# Patient Record
Sex: Male | Born: 1970 | Race: White | Hispanic: No | Smoking: Former smoker
Health system: Southern US, Community
[De-identification: ages and names within clinical notes are randomized; demographics above are authoritative.]

## PROBLEM LIST (undated history)

## (undated) DIAGNOSIS — I10 Essential (primary) hypertension: Secondary | ICD-10-CM

## (undated) DIAGNOSIS — N289 Disorder of kidney and ureter, unspecified: Secondary | ICD-10-CM

## (undated) DIAGNOSIS — K219 Gastro-esophageal reflux disease without esophagitis: Secondary | ICD-10-CM

## (undated) DIAGNOSIS — C801 Malignant (primary) neoplasm, unspecified: Secondary | ICD-10-CM

## (undated) DIAGNOSIS — R569 Unspecified convulsions: Secondary | ICD-10-CM

## (undated) DIAGNOSIS — Z87828 Personal history of other (healed) physical injury and trauma: Secondary | ICD-10-CM

## (undated) HISTORY — PX: APPENDECTOMY: SHX54

## (undated) HISTORY — DX: Gastro-esophageal reflux disease without esophagitis: K21.9

---

## 2001-04-28 ENCOUNTER — Ambulatory Visit (HOSPITAL_COMMUNITY): Admission: RE | Admit: 2001-04-28 | Discharge: 2001-04-28 | Payer: Self-pay | Admitting: Neurology

## 2001-04-28 ENCOUNTER — Encounter: Payer: Self-pay | Admitting: Neurology

## 2004-02-21 ENCOUNTER — Emergency Department (HOSPITAL_COMMUNITY): Admission: EM | Admit: 2004-02-21 | Discharge: 2004-02-22 | Payer: Self-pay | Admitting: Emergency Medicine

## 2005-02-10 HISTORY — PX: OTHER SURGICAL HISTORY: SHX169

## 2005-09-28 ENCOUNTER — Emergency Department (HOSPITAL_COMMUNITY): Admission: EM | Admit: 2005-09-28 | Discharge: 2005-09-28 | Payer: Self-pay | Admitting: Emergency Medicine

## 2005-10-06 ENCOUNTER — Emergency Department (HOSPITAL_COMMUNITY): Admission: EM | Admit: 2005-10-06 | Discharge: 2005-10-06 | Payer: Self-pay | Admitting: Emergency Medicine

## 2005-10-15 ENCOUNTER — Encounter (INDEPENDENT_AMBULATORY_CARE_PROVIDER_SITE_OTHER): Payer: Self-pay | Admitting: Specialist

## 2005-10-15 ENCOUNTER — Observation Stay (HOSPITAL_COMMUNITY): Admission: RE | Admit: 2005-10-15 | Discharge: 2005-10-16 | Payer: Self-pay | Admitting: Urology

## 2005-11-13 ENCOUNTER — Ambulatory Visit (HOSPITAL_COMMUNITY): Payer: Self-pay | Admitting: Oncology

## 2005-11-13 ENCOUNTER — Encounter (HOSPITAL_COMMUNITY): Admission: RE | Admit: 2005-11-13 | Discharge: 2005-12-13 | Payer: Self-pay | Admitting: Oncology

## 2005-11-13 ENCOUNTER — Encounter: Admission: RE | Admit: 2005-11-13 | Discharge: 2005-11-13 | Payer: Self-pay | Admitting: Oncology

## 2005-11-19 ENCOUNTER — Ambulatory Visit (HOSPITAL_COMMUNITY): Admission: RE | Admit: 2005-11-19 | Discharge: 2005-11-19 | Payer: Self-pay | Admitting: Oncology

## 2005-12-16 ENCOUNTER — Ambulatory Visit (HOSPITAL_COMMUNITY): Payer: Self-pay | Admitting: Oncology

## 2005-12-16 ENCOUNTER — Encounter (HOSPITAL_COMMUNITY): Admission: RE | Admit: 2005-12-16 | Discharge: 2006-01-15 | Payer: Self-pay | Admitting: Oncology

## 2005-12-16 ENCOUNTER — Encounter: Admission: RE | Admit: 2005-12-16 | Discharge: 2005-12-16 | Payer: Self-pay | Admitting: Oncology

## 2006-03-24 ENCOUNTER — Emergency Department (HOSPITAL_COMMUNITY): Admission: EM | Admit: 2006-03-24 | Discharge: 2006-03-24 | Payer: Self-pay | Admitting: Emergency Medicine

## 2006-03-27 ENCOUNTER — Encounter (HOSPITAL_COMMUNITY): Admission: RE | Admit: 2006-03-27 | Discharge: 2006-04-26 | Payer: Self-pay | Admitting: Oncology

## 2006-03-27 ENCOUNTER — Ambulatory Visit (HOSPITAL_COMMUNITY): Payer: Self-pay | Admitting: Oncology

## 2006-04-03 ENCOUNTER — Emergency Department (HOSPITAL_COMMUNITY): Admission: EM | Admit: 2006-04-03 | Discharge: 2006-04-03 | Payer: Self-pay | Admitting: Emergency Medicine

## 2006-04-30 ENCOUNTER — Emergency Department (HOSPITAL_COMMUNITY): Admission: EM | Admit: 2006-04-30 | Discharge: 2006-04-30 | Payer: Self-pay | Admitting: Emergency Medicine

## 2006-07-15 ENCOUNTER — Emergency Department (HOSPITAL_COMMUNITY): Admission: EM | Admit: 2006-07-15 | Discharge: 2006-07-15 | Payer: Self-pay | Admitting: Emergency Medicine

## 2006-09-28 ENCOUNTER — Encounter (HOSPITAL_COMMUNITY): Admission: RE | Admit: 2006-09-28 | Discharge: 2006-10-28 | Payer: Self-pay | Admitting: Oncology

## 2006-09-28 ENCOUNTER — Ambulatory Visit (HOSPITAL_COMMUNITY): Payer: Self-pay | Admitting: Oncology

## 2007-03-09 ENCOUNTER — Emergency Department (HOSPITAL_COMMUNITY): Admission: EM | Admit: 2007-03-09 | Discharge: 2007-03-09 | Payer: Self-pay | Admitting: Emergency Medicine

## 2007-07-12 ENCOUNTER — Emergency Department (HOSPITAL_COMMUNITY): Admission: EM | Admit: 2007-07-12 | Discharge: 2007-07-12 | Payer: Self-pay | Admitting: Emergency Medicine

## 2008-08-14 ENCOUNTER — Emergency Department (HOSPITAL_COMMUNITY): Admission: EM | Admit: 2008-08-14 | Discharge: 2008-08-14 | Payer: Self-pay | Admitting: Emergency Medicine

## 2009-02-05 ENCOUNTER — Emergency Department (HOSPITAL_COMMUNITY): Admission: EM | Admit: 2009-02-05 | Discharge: 2009-02-05 | Payer: Self-pay | Admitting: Emergency Medicine

## 2009-02-17 ENCOUNTER — Emergency Department (HOSPITAL_COMMUNITY): Admission: EM | Admit: 2009-02-17 | Discharge: 2009-02-17 | Payer: Self-pay | Admitting: Emergency Medicine

## 2009-08-21 ENCOUNTER — Emergency Department (HOSPITAL_COMMUNITY): Admission: EM | Admit: 2009-08-21 | Discharge: 2009-08-22 | Payer: Self-pay | Admitting: Emergency Medicine

## 2010-03-03 ENCOUNTER — Encounter (HOSPITAL_COMMUNITY): Payer: Self-pay | Admitting: Oncology

## 2010-03-03 ENCOUNTER — Encounter: Payer: Self-pay | Admitting: Emergency Medicine

## 2010-05-13 LAB — DIFFERENTIAL
Basophils Absolute: 0 10*3/uL (ref 0.0–0.1)
Basophils Relative: 0 % (ref 0–1)
Eosinophils Relative: 1 % (ref 0–5)
Lymphs Abs: 2.7 10*3/uL (ref 0.7–4.0)
Monocytes Absolute: 0.6 10*3/uL (ref 0.1–1.0)
Monocytes Relative: 6 % (ref 3–12)
Neutrophils Relative %: 67 % (ref 43–77)

## 2010-05-13 LAB — CBC
HCT: 53.2 % — ABNORMAL HIGH (ref 39.0–52.0)
Hemoglobin: 18.2 g/dL — ABNORMAL HIGH (ref 13.0–17.0)
MCHC: 34.2 g/dL (ref 30.0–36.0)
Platelets: 221 10*3/uL (ref 150–400)
RBC: 5.38 MIL/uL (ref 4.22–5.81)
RDW: 12.8 % (ref 11.5–15.5)

## 2010-05-13 LAB — BASIC METABOLIC PANEL
BUN: 10 mg/dL (ref 6–23)
GFR calc Af Amer: >60 mL/min (ref >60)
GFR calc non Af Amer: >60 mL/min (ref >60)

## 2010-05-13 LAB — URINALYSIS, ROUTINE W REFLEX MICROSCOPIC
Bilirubin Urine: NEGATIVE
Glucose, UA: NEGATIVE mg/dL
Hgb urine dipstick: NEGATIVE
Ketones, ur: NEGATIVE mg/dL
Nitrite: NEGATIVE
Protein, ur: NEGATIVE mg/dL
Urobilinogen, UA: 0.2 mg/dL (ref 0.0–1.0)
pH: 5.5 (ref 5.0–8.0)

## 2010-05-19 LAB — CBC
Hemoglobin: 17.1 g/dL — ABNORMAL HIGH (ref 13.0–17.0)
MCHC: 35.2 g/dL (ref 30.0–36.0)
WBC: 8.7 10*3/uL (ref 4.0–10.5)

## 2010-05-19 LAB — BASIC METABOLIC PANEL
CO2: 30 mEq/L (ref 19–32)
Calcium: 9.8 mg/dL (ref 8.4–10.5)
Chloride: 107 mEq/L (ref 96–112)
Creatinine, Ser: 1.46 mg/dL (ref 0.4–1.5)
GFR calc non Af Amer: 55 mL/min — ABNORMAL LOW (ref >60)
Potassium: 3.9 mEq/L (ref 3.5–5.1)
Sodium: 141 mEq/L (ref 135–145)

## 2010-05-19 LAB — DIFFERENTIAL
Eosinophils Relative: 3 % (ref 0–5)
Lymphocytes Relative: 30 % (ref 12–46)
Lymphs Abs: 2.6 10*3/uL (ref 0.7–4.0)
Monocytes Absolute: 0.6 10*3/uL (ref 0.1–1.0)
Neutro Abs: 5.2 10*3/uL (ref 1.7–7.7)

## 2010-05-19 LAB — URINALYSIS, ROUTINE W REFLEX MICROSCOPIC
Bilirubin Urine: NEGATIVE
Glucose, UA: NEGATIVE mg/dL
Hgb urine dipstick: NEGATIVE
Protein, ur: NEGATIVE mg/dL
pH: 5.5 (ref 5.0–8.0)

## 2010-05-29 ENCOUNTER — Emergency Department (HOSPITAL_COMMUNITY)
Admission: EM | Admit: 2010-05-29 | Discharge: 2010-05-29 | Disposition: A | Discharge status: Home / Self Care | Payer: Self-pay | Attending: Emergency Medicine | Admitting: Emergency Medicine

## 2010-05-29 DIAGNOSIS — K089 Disorder of teeth and supporting structures, unspecified: Secondary | ICD-10-CM | POA: Insufficient documentation

## 2010-05-29 DIAGNOSIS — Z8547 Personal history of malignant neoplasm of testis: Secondary | ICD-10-CM | POA: Insufficient documentation

## 2010-05-29 DIAGNOSIS — G40909 Epilepsy, unspecified, not intractable, without status epilepticus: Secondary | ICD-10-CM | POA: Insufficient documentation

## 2010-06-28 NOTE — Op Note (Signed)
Larry Hoover, Larry Hoover                ACCOUNT NO.:  [id number]   MEDICAL RECORD NO.:  [id number]          PATIENT TYPE:  OBV   LOCATION:  A318                          FACILITY:  [facility]   PHYSICIAN:  Dennie Maizes, M.D.   DATE OF BIRTH:  02/16/1970   DATE OF PROCEDURE:  10/15/2005  DATE OF DISCHARGE:                                 OPERATIVE REPORT   PREOPERATIVE DIAGNOSIS:  Right testicular mass.   POSTOPERATIVE DIAGNOSIS:  Right testicular mass.   OPERATIVE PROCEDURE:  Right inguinal exploration and right radical  orchiectomy.   ANESTHESIA:  General.   SURGEON:  Dennie Maizes, M.D.   COMPLICATIONS:  None.   ESTIMATED BLOOD LOSS:  Minimal.   SPECIMEN:  Right testis and spermatic cord.   COMPLICATIONS:  None.   INDICATIONS FOR PROCEDURE:  This 40 year old male with a solid right  testicular mass was taken to the OR today for right inguinal exploration and  right radical orchiectomy.   DESCRIPTION OF PROCEDURE:  General anesthesia was induced, and the patient  was placed on the OR table in the supine position.  The lower abdomen and  genitalia were prepped and draped in a sterile fashion.  Examination  revealed an 8-cm size right solid testicular mass.   A right inguinal incision was then made.  The subcutaneous tissues and  Scarpa's fascia were divided to expose the external oblique aponeurosis.  The external inguinal ring was identified.  The external oblique aponeurosis  was opened and the inguinal canal was entered.  The spermatic cord  structures were then held with a Penrose drain.  The ilioinguinal nerve was  identified and safeguarded.  The spermatic cord was then crossclamped with a  hemostat.  The testis was then delivered out of the inguinal incision.  The  testis was separated from the scrotal bed.  The spermatic cord was then  dissected free up to the level of the internal inguinal ring.  The vas was  identified, clamped, divided and ligated on both sides.  The  spermatic cord  was then clamped at the level of the internal inguinal ring and divided.  Suture ligation of the proximal end of the spermatic cord was then done.  A  marking suture with 2-0 silk was also applied over the spermatic cord.  There was no active bleeding at this time.  The external oblique aponeurosis  was closed using 3-0 Vicryl.  The Scarpa's fascia was then closed using 3-0  Vicryl.  The wound was then irrigated with saline.  Then 5 mL of 0.5%  Marcaine was injected around the incision for postoperative  analgesia.  The skin incision was then closed using staples.  Estimated  blood loss was minimal.  The sponges and instruments were correct x2 at the  time of closure.  The patient was transferred to the PACU in a satisfactory  condition.      Dennie Maizes, M.D.  Electronically Signed     SK/MEDQ  D:  10/15/2005  T:  10/15/2005  Job:  [id number]

## 2010-06-28 NOTE — H&P (Signed)
Larry Hoover, GOLDWATER                ACCOUNT NO.:  [id number]   MEDICAL RECORD NO.:  [id number]          PATIENT TYPE:  AMB   LOCATION:  DAY                           FACILITY:  [facility]   PHYSICIAN:  Dennie Maizes, M.D.   DATE OF BIRTH:  11-Sep-1970   DATE OF ADMISSION:  10/15/2005  DATE OF DISCHARGE:  LH                                HISTORY & PHYSICAL   CHIEF COMPLAINT:  Swelling of the testes, pain in right testes.   HISTORY OF PRESENT ILLNESS:  This 40 year old male was referred to me from  the ER at [facility].  He has noticed a swelling of the right  testes for the past four months.  Swelling has been increasing in size.  There is no history of trauma to the scrotum.  Developed severe right testes  and he went to the emergency room at [facility].  He was evaluated  with an ultrasound of the scrotum. These revealed two solid testicular  masses on the right side.  Left testes was normal.  Also was evaluated with  a CT of the abdomen and pelvis with contrast.  This revealed small right  renal calculi, bilateral renal cysts were noted.  There was no evidence of  metastatic lesions or lymphadenopathy inside the abdomen.  The patient was  referred to me for further evaluation and management.   The patient did not have any voiding difficulty.  He has urinary frequency  x3 to 4 and nocturia and x1 to 2.  There is no history of fever, chills or  flank pain.  There is no past history of GU disease.   PAST MEDICAL HISTORY:  1. He has seizure disorder.  2. Memory loss.  3. Cluster headaches.  4. Depression.   MEDICATIONS:  1. Percocet 7.5/325 mg 1 p.o. q.8h. p.r.n. pain.  2. Xanax 2 mg p.o. q.i.d.  3. Lexapro 10 mg 1 p.o. daily.  4. Aspirin 81 mg p.o. daily which has been discontinued for the surgery.   1. Imipramine 50 mg at bed time.  2. Phenobarbital 30 mg 2 at bed time.   ALLERGIES:  PENICILLIN.   EXAMINATION:  Height 5 feet 10, weight 155 pounds.  Head,  eyes, ears, nose  and throat normal.  Neck no masses.  Lungs clear to auscultation.  Heart  regular rate and rhythm.  No murmurs.  No flank masses or CVA tenderness.  Bladder not palpable.  Skin is normal.  Left testes is normal.  There is a  lot of swelling in the right testes region.  It is firm in consistency.  Testes as mentioned is about 8 x 6 cm in size.  Rectal examination not done.   IMPRESSION:  Right testicular mass.  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   PLAN:  I have discussed with the patient and his wife regarding the  diagnosis and management options.  He is scheduled to undergo a right  inguinal exploration with a right orchectomy and anesthesia in the short-  stay center.  I explained to the patient regarding the diagnosis, operative  details,  alternate treatments, the outcome, possible risks and complications  and he has agreed for the procedure to be done.  In view of the history of  his seizure disorder, he will be kept under observation for 24 hours.      Dennie Maizes, M.D.  Electronically Signed     SK/MEDQ  D:  10/14/2005  T:  10/15/2005  Job:  [id number]

## 2010-10-31 LAB — POCT I-STAT CREATININE: Creatinine, Ser: 1

## 2010-11-22 LAB — COMPREHENSIVE METABOLIC PANEL
ALT: 18
Albumin: 3.9
Alkaline Phosphatase: 69
Chloride: 104
Glucose, Bld: 94
Potassium: 3.6
Sodium: 138
Total Bilirubin: 0.5
Total Protein: 6.4

## 2010-11-22 LAB — AFP TUMOR MARKER: AFP-Tumor Marker: 1.7

## 2010-11-22 LAB — CBC
HCT: 44
Hemoglobin: 14.9
Platelets: 236
RDW: 13.2
WBC: 7

## 2010-11-22 LAB — DIFFERENTIAL
Basophils Absolute: 0
Basophils Relative: 1
Eosinophils Absolute: 0.4
Monocytes Absolute: 0.4
Monocytes Relative: 6

## 2010-11-22 LAB — BETA HCG QUANT (REF LAB): Beta hCG, Tumor Marker: 0.8 m[IU]/mL (ref <5.0)

## 2011-01-07 ENCOUNTER — Emergency Department (HOSPITAL_COMMUNITY)
Admission: EM | Admit: 2011-01-07 | Discharge: 2011-01-07 | Disposition: A | Discharge status: Home / Self Care | Payer: Self-pay | Attending: Emergency Medicine | Admitting: Emergency Medicine

## 2011-01-07 ENCOUNTER — Encounter: Payer: Self-pay | Admitting: Emergency Medicine

## 2011-01-07 ENCOUNTER — Emergency Department (HOSPITAL_COMMUNITY): Payer: Self-pay

## 2011-01-07 DIAGNOSIS — Z8547 Personal history of malignant neoplasm of testis: Secondary | ICD-10-CM | POA: Insufficient documentation

## 2011-01-07 DIAGNOSIS — G8929 Other chronic pain: Secondary | ICD-10-CM | POA: Insufficient documentation

## 2011-01-07 DIAGNOSIS — M549 Dorsalgia, unspecified: Secondary | ICD-10-CM | POA: Insufficient documentation

## 2011-01-07 HISTORY — DX: Malignant (primary) neoplasm, unspecified: C80.1

## 2011-01-07 HISTORY — DX: Unspecified convulsions: R56.9

## 2011-01-07 MED ORDER — KETOROLAC TROMETHAMINE 60 MG/2ML IM SOLN
60.0000 mg | Freq: Once | INTRAMUSCULAR | Status: AC
  Administered 2011-01-07: 60 mg via INTRAMUSCULAR
  Filled 2011-01-07: qty 2

## 2011-01-07 MED ORDER — HYDROMORPHONE HCL PF 2 MG/ML IJ SOLN
2.0000 mg | Freq: Once | INTRAMUSCULAR | Status: AC
  Administered 2011-01-07: 2 mg via INTRAMUSCULAR
  Filled 2011-01-07: qty 1

## 2011-01-07 NOTE — ED Notes (Signed)
Pt reports chronic back that started 4 months ago. Pt states "I have a crushed vertabrae in my back and I'm suppose to have surgery but I can't afford it." pt  Reports severe pain started last night after carrying fire wood.

## 2011-01-07 NOTE — ED Notes (Signed)
Pain medication given as ordered. Pt rates lower back pain 10/10. No needs voiced. Family member in room.

## 2011-01-07 NOTE — ED Provider Notes (Signed)
History     CSN: [id number] Arrival date & time: 01/07/2011  5:00 AM   First MD Initiated Contact with Patient 01/07/11 0534      Chief Complaint  Patient presents with  . Back Pain    (Consider location/radiation/quality/duration/timing/severity/associated sxs/prior treatment) Patient is a 40 y.o. male presenting with back pain. The history is provided by the patient.  Back Pain  This is a chronic problem. The problem occurs constantly. The problem has not changed since onset.The pain is associated with lifting heavy objects. The pain is present in the lumbar spine. The quality of the pain is described as shooting. The pain radiates to the right thigh. The pain is severe. The symptoms are aggravated by bending, twisting and certain positions. The pain is the same all the time. Pertinent negatives include no chest pain, no fever, no numbness, no weight loss, no headaches, no abdominal pain, no abdominal swelling, no bowel incontinence, no perianal numbness, no bladder incontinence, no dysuria, no pelvic pain, no leg pain, no paresthesias, no paresis, no tingling and no weakness. Treatments tried: Xanax and Percocet without relief. The treatment provided no relief. Risk factors include a sedentary lifestyle.   patient with chronic back pain presents with right lower back pain rating right leg after lifting firewood. Pain improved with heating pad and medications at home, presents here. Sharp in quality. Has history of testicular cancer about 5 years ago. He has been evaluated by multiple physicians for back pain and told that he needs an MRI and it has also been referred to a Careers adviser. No fall or trauma otherwise. No Alleviating factors.  Past Medical History  Diagnosis Date  . Cancer   . Seizures     Past Surgical History  Procedure Date  . Right testical removed 2007    History reviewed. No pertinent family history.  History  Substance Use Topics  . Smoking status: Never Smoker     . Smokeless tobacco: Not on file  . Alcohol Use: No      Review of Systems  Constitutional: Negative for fever, chills and weight loss.  HENT: Negative for neck pain and neck stiffness.   Eyes: Negative for pain.  Respiratory: Negative for shortness of breath.   Cardiovascular: Negative for chest pain, palpitations and leg swelling.  Gastrointestinal: Negative for vomiting, abdominal pain and bowel incontinence.  Genitourinary: Negative for bladder incontinence, dysuria and pelvic pain.  Musculoskeletal: Positive for back pain.  Skin: Negative for rash.  Neurological: Negative for tingling, weakness, numbness, headaches and paresthesias.  All other systems reviewed and are negative.    Allergies  Penicillins  Home Medications   Current Outpatient Rx  Name Route Sig Dispense Refill  . ALPRAZOLAM 1 MG PO TABS Oral Take 1 mg by mouth every 2 (two) hours as needed.      . CYCLOBENZAPRINE HCL 10 MG PO TABS Oral Take 10 mg by mouth 3 (three) times daily as needed.      . OXYCODONE-ACETAMINOPHEN 10-325 MG PO TABS       . TRAMADOL HCL 50 MG PO TABS Oral Take 50 mg by mouth 4 (four) times daily as needed. Maximum dose= 8 tablets per day       BP 118/94  Pulse 100  Temp(Src) 97.7 F (36.5 C) (Oral)  Resp 18  Ht 5\' 10"  (1.778 m)  Wt 185 lb (83.915 kg)  BMI 26.54 kg/m2  SpO2 97%  Physical Exam  Constitutional: He is oriented to person, place, and  time. He appears well-developed and well-nourished.  HENT:  Head: Normocephalic and atraumatic.  Eyes: Conjunctivae and EOM are normal. Pupils are equal, round, and reactive to light.  Neck: Trachea normal. Neck supple. No thyromegaly present.       No midline cervical, thoracic or lumbar tenderness  Cardiovascular: Normal rate, regular rhythm, S1 normal, S2 normal and normal pulses.     No systolic murmur is present   No diastolic murmur is present  Pulses:      Radial pulses are 2+ on the right side, and 2+ on the left side.   Pulmonary/Chest: Effort normal and breath sounds normal. He has no wheezes. He has no rhonchi. He has no rales. He exhibits no tenderness.  Abdominal: Soft. Normal appearance and bowel sounds are normal. There is no tenderness. There is no CVA tenderness and negative Murphy's sign.  Musculoskeletal:       Right lower paralumbar tenderness. No midline deformity or step off. No lesions. Lower extremity strengths, sensorium to light touch, and DTRs all equal and intact. No deficits and distal neurovascular intact.  Neurological: He is alert and oriented to person, place, and time. He has normal strength. No cranial nerve deficit or sensory deficit. GCS eye subscore is 4. GCS verbal subscore is 5. GCS motor subscore is 6.  Skin: Skin is warm and dry. No rash noted. He is not diaphoretic.  Psychiatric: His speech is normal.       Cooperative and appropriate    ED Course  Procedures (including critical care time)  Pain control and imaging given history of cancer.   MDM   Chronic back pain with acute exacerbation of same.. IM Dilaudid which improved symptoms. No new symptoms or indication for emergent MRI at this time. Patient has followup in primary care clinic, and has previous referrals for surgery.   Stable for discharge home with back pain precautions.        Sunnie Nielsen, MD 01/07/11 [phone]

## 2012-05-07 ENCOUNTER — Encounter (HOSPITAL_COMMUNITY): Payer: Self-pay

## 2012-05-07 ENCOUNTER — Emergency Department (HOSPITAL_COMMUNITY)
Admission: EM | Admit: 2012-05-07 | Discharge: 2012-05-08 | Disposition: A | Discharge status: Other Acute Inpatient Hospital | Payer: Self-pay | Attending: Dermatology | Admitting: Dermatology

## 2012-05-07 DIAGNOSIS — F329 Major depressive disorder, single episode, unspecified: Secondary | ICD-10-CM

## 2012-05-07 DIAGNOSIS — Z79899 Other long term (current) drug therapy: Secondary | ICD-10-CM | POA: Insufficient documentation

## 2012-05-07 DIAGNOSIS — Z8589 Personal history of malignant neoplasm of other organs and systems: Secondary | ICD-10-CM | POA: Insufficient documentation

## 2012-05-07 DIAGNOSIS — F411 Generalized anxiety disorder: Secondary | ICD-10-CM | POA: Insufficient documentation

## 2012-05-07 DIAGNOSIS — F3289 Other specified depressive episodes: Secondary | ICD-10-CM | POA: Insufficient documentation

## 2012-05-07 DIAGNOSIS — Z8669 Personal history of other diseases of the nervous system and sense organs: Secondary | ICD-10-CM | POA: Insufficient documentation

## 2012-05-07 LAB — CBC
MCH: 32.2 pg (ref 26.0–34.0)
MCHC: 35.7 g/dL (ref 30.0–36.0)
MCV: 90.4 fL (ref 78.0–100.0)
Platelets: 244 10*3/uL (ref 150–400)
RDW: 13.6 % (ref 11.5–15.5)

## 2012-05-07 LAB — COMPREHENSIVE METABOLIC PANEL
AST: 17 U/L (ref 0–37)
Albumin: 4.7 g/dL (ref 3.5–5.2)
Calcium: 9.7 mg/dL (ref 8.4–10.5)
Creatinine, Ser: 1.14 mg/dL (ref 0.50–1.35)
GFR calc non Af Amer: 80 mL/min — ABNORMAL LOW (ref >90)
Total Protein: 8.1 g/dL (ref 6.0–8.3)

## 2012-05-07 LAB — RAPID URINE DRUG SCREEN, HOSP PERFORMED
Benzodiazepines: POSITIVE — AB
Cocaine: NOT DETECTED
Opiates: POSITIVE — AB
Tetrahydrocannabinol: POSITIVE — AB

## 2012-05-07 MED ORDER — ACETAMINOPHEN 325 MG PO TABS
650.0000 mg | ORAL_TABLET | ORAL | Status: DC | PRN
Start: 2012-05-07 — End: 2012-05-08
  Administered 2012-05-08: 650 mg via ORAL
  Filled 2012-05-07: qty 2

## 2012-05-07 MED ORDER — IBUPROFEN 400 MG PO TABS: 600.0000 mg | ORAL_TABLET | Freq: Three times a day (TID) | ORAL | Status: DC | PRN

## 2012-05-07 MED ORDER — LORAZEPAM 1 MG PO TABS
1.0000 mg | ORAL_TABLET | Freq: Three times a day (TID) | ORAL | Status: DC | PRN
  Administered 2012-05-07: 1 mg via ORAL
  Filled 2012-05-07: qty 1

## 2012-05-07 MED ORDER — HYDROCODONE-ACETAMINOPHEN 5-325 MG PO TABS
2.0000 | ORAL_TABLET | Freq: Once | ORAL | Status: AC
  Administered 2012-05-07: 2 via ORAL
  Filled 2012-05-07: qty 2

## 2012-05-07 NOTE — ED Notes (Signed)
Pt states he is having bad thoughts in his head. Complain of multiple medical problems, no money, can't get on disability. " It's time for me to go "

## 2012-05-07 NOTE — Progress Notes (Signed)
11:20 PM Telepsychiatry consultant recommends inpatient psychiatric care.  [facility] called to arrange placement.

## 2012-05-07 NOTE — ED Notes (Signed)
Pt brought to ED by police on IVC. Was sent here from [facility]

## 2012-05-07 NOTE — ED Provider Notes (Addendum)
History     CSN: [id number]  Arrival date & time 05/07/12  1759   First MD Initiated Contact with Patient 05/07/12 1818      Chief Complaint  Patient presents with  . V70.1    (Consider location/radiation/quality/duration/timing/severity/associated sxs/prior treatment) The history is provided by the patient.  pt with hx anxiety, depression, chronic pain, states he is very frustrated that he hasnt been able to establish his disability over the course of the past 10-12 years.  He indicates has hx testicular ca, states the chemotherapy many yrs ago 'tore him up'. States he has financial stressors as well. States some times 'bad thoughts' pop into his head about 'ending it all'.  Denies thoughts of harm to others. Occasionally thc use, denies other etoh or drug abuse. Denies any recent illness or new symptoms. States went to [facility] yesterday for his chronic pain issues and was told he needed to follow up with pcp, pain clinic and social work. Went to [facility] today and was sent to ED.  Pt states is eating and drinking normally, no recent wt change. Sleeping ok at night.     Past Medical History  Diagnosis Date  . Cancer   . Seizures     Past Surgical History  Procedure Laterality Date  . Right testical removed  2007  . Appendectomy      No family history on file.  History  Substance Use Topics  . Smoking status: Never Smoker   . Smokeless tobacco: Not on file  . Alcohol Use: No      Review of Systems  Constitutional: Negative for fever and chills.  HENT: Negative for neck pain.   Eyes: Negative for redness.  Respiratory: Negative for shortness of breath.   Cardiovascular: Negative for chest pain.  Gastrointestinal: Negative for vomiting and abdominal pain.  Genitourinary: Negative for flank pain.  Musculoskeletal: Positive for back pain.  Skin: Negative for rash.  Neurological: Negative for weakness, numbness and headaches.  Hematological: Does not bruise/bleed  easily.  Psychiatric/Behavioral: The patient is nervous/anxious.     Allergies  Penicillins and Morphine and related  Home Medications   Current Outpatient Rx  Name  Route  Sig  Dispense  Refill  . ALPRAZolam (XANAX) 1 MG tablet   Oral   Take 1 mg by mouth every 2 (two) hours as needed.           . cyclobenzaprine (FLEXERIL) 10 MG tablet   Oral   Take 10 mg by mouth 3 (three) times daily as needed.           Marland Kitchen oxyCODONE-acetaminophen (PERCOCET) 10-325 MG per tablet                . traMADol (ULTRAM) 50 MG tablet   Oral   Take 50 mg by mouth 4 (four) times daily as needed. Maximum dose= 8 tablets per day            Ht 5\' 10"  (1.778 m)  Wt 175 lb (79.379 kg)  BMI 25.11 kg/m2  Physical Exam  Nursing note and vitals reviewed. Constitutional: He is oriented to person, place, and time. He appears well-developed and well-nourished. No distress.  HENT:  Head: Atraumatic.  Mouth/Throat: Oropharynx is clear and moist.  Eyes: Conjunctivae are normal. Pupils are equal, round, and reactive to light. No scleral icterus.  Neck: Neck supple. No tracheal deviation present. No thyromegaly present.  Cardiovascular: Normal rate, regular rhythm, normal heart sounds and intact distal pulses.  Pulmonary/Chest: Effort normal and breath sounds normal. No accessory muscle usage. No respiratory distress.  Abdominal: Soft. Bowel sounds are normal. He exhibits no distension. There is no tenderness.  Musculoskeletal: Normal range of motion. He exhibits no edema and no tenderness.  Spine non tender  Neurological: He is alert and oriented to person, place, and time.  Motor intact bil. Steady gait.   Skin: Skin is warm and dry. He is not diaphoretic.  Psychiatric:  Mildly anxious. Very talkative. Does not appear acutely depressed. No delusions or hallucinations.     ED Course  Procedures (including critical care time)  Results for orders placed during the hospital encounter of  05/07/12  CBC      Result Value Range   WBC 11.0 (*) 4.0 - 10.5 K/uL   RBC 5.52  4.22 - 5.81 MIL/uL   Hemoglobin 17.8 (*) 13.0 - 17.0 g/dL   HCT 81.1  91.4 - 78.2 %   MCV 90.4  78.0 - 100.0 fL   MCH 32.2  26.0 - 34.0 pg   MCHC 35.7  30.0 - 36.0 g/dL   RDW 95.6  21.3 - 08.6 %   Platelets 244  150 - 400 K/uL  COMPREHENSIVE METABOLIC PANEL      Result Value Range   Sodium 136  135 - 145 mEq/L   Potassium 3.5  3.5 - 5.1 mEq/L   Chloride 96  96 - 112 mEq/L   CO2 27  19 - 32 mEq/L   Glucose, Bld 105 (*) 70 - 99 mg/dL   BUN 20  6 - 23 mg/dL   Creatinine, Ser 5.78  0.50 - 1.35 mg/dL   Calcium 9.7  8.4 - 46.9 mg/dL   Total Protein 8.1  6.0 - 8.3 g/dL   Albumin 4.7  3.5 - 5.2 g/dL   AST 17  0 - 37 U/L   ALT 17  0 - 53 U/L   Alkaline Phosphatase 86  39 - 117 U/L   Total Bilirubin 0.4  0.3 - 1.2 mg/dL   GFR calc non Af Amer 80 (*) >90 mL/min   GFR calc Af Amer >90  >90 mL/min  ETHANOL      Result Value Range   Alcohol, Ethyl (B) <11  0 - 11 mg/dL  URINE RAPID DRUG SCREEN (HOSP PERFORMED)      Result Value Range   Opiates POSITIVE (*) NONE DETECTED   Cocaine NONE DETECTED  NONE DETECTED   Benzodiazepines POSITIVE (*) NONE DETECTED   Amphetamines NONE DETECTED  NONE DETECTED   Tetrahydrocannabinol POSITIVE (*) NONE DETECTED   Barbiturates NONE DETECTED  NONE DETECTED        MDM  Labs. Pt asking for food/drink and pain med.   Reviewed nursing notes and prior charts for additional history.   Pt requests pain med. vicodin po.  Recheck pt comfortable. No distress. Awaiting telepsych eval.   telepsych eval pending.  Signed out to Dr Ignacia Palma to follow up with telepsych eval, recheck pt, and dispo appropriately.       Suzi Roots, MD 05/07/12 1955  Suzi Roots, MD 05/07/12 [phone]

## 2012-05-07 NOTE — ED Notes (Signed)
Per EDP it is acceptable to sign off on law enforcement leaving.

## 2012-05-08 ENCOUNTER — Encounter (HOSPITAL_COMMUNITY): Payer: Self-pay

## 2012-05-08 ENCOUNTER — Inpatient Hospital Stay (HOSPITAL_COMMUNITY)
Admission: AD | Admit: 2012-05-08 | Discharge: 2012-05-12 | DRG: 897 | Disposition: A | Discharge status: Home / Self Care | Payer: 59 | Source: Intra-hospital | Attending: Psychiatry | Admitting: Psychiatry

## 2012-05-08 DIAGNOSIS — C6290 Malignant neoplasm of unspecified testis, unspecified whether descended or undescended: Secondary | ICD-10-CM | POA: Diagnosis present

## 2012-05-08 DIAGNOSIS — F1920 Other psychoactive substance dependence, uncomplicated: Secondary | ICD-10-CM | POA: Diagnosis present

## 2012-05-08 DIAGNOSIS — F319 Bipolar disorder, unspecified: Secondary | ICD-10-CM | POA: Diagnosis present

## 2012-05-08 DIAGNOSIS — G894 Chronic pain syndrome: Secondary | ICD-10-CM | POA: Diagnosis present

## 2012-05-08 DIAGNOSIS — F0630 Mood disorder due to known physiological condition, unspecified: Secondary | ICD-10-CM | POA: Diagnosis present

## 2012-05-08 DIAGNOSIS — F1310 Sedative, hypnotic or anxiolytic abuse, uncomplicated: Secondary | ICD-10-CM | POA: Diagnosis present

## 2012-05-08 DIAGNOSIS — Z79899 Other long term (current) drug therapy: Secondary | ICD-10-CM

## 2012-05-08 DIAGNOSIS — R45851 Suicidal ideations: Secondary | ICD-10-CM

## 2012-05-08 DIAGNOSIS — F329 Major depressive disorder, single episode, unspecified: Secondary | ICD-10-CM

## 2012-05-08 DIAGNOSIS — F1110 Opioid abuse, uncomplicated: Secondary | ICD-10-CM

## 2012-05-08 DIAGNOSIS — G40909 Epilepsy, unspecified, not intractable, without status epilepticus: Secondary | ICD-10-CM | POA: Diagnosis present

## 2012-05-08 MED ORDER — CHLORDIAZEPOXIDE HCL 25 MG PO CAPS
25.0000 mg | ORAL_CAPSULE | Freq: Three times a day (TID) | ORAL | Status: AC
  Administered 2012-05-09 (×2): 25 mg via ORAL
  Filled 2012-05-08: qty 1

## 2012-05-08 MED ORDER — LOPERAMIDE HCL 2 MG PO CAPS: 2.0000 mg | ORAL_CAPSULE | ORAL | Status: DC | PRN

## 2012-05-08 MED ORDER — HYDROXYZINE HCL 25 MG PO TABS: 25.0000 mg | ORAL_TABLET | Freq: Four times a day (QID) | ORAL | Status: AC | PRN

## 2012-05-08 MED ORDER — ACETAMINOPHEN 325 MG PO TABS: 650.0000 mg | ORAL_TABLET | Freq: Once | ORAL | Status: AC

## 2012-05-08 MED ORDER — NAPROXEN 500 MG PO TABS
500.0000 mg | ORAL_TABLET | Freq: Two times a day (BID) | ORAL | Status: DC | PRN
  Administered 2012-05-08 – 2012-05-12 (×8): 500 mg via ORAL
  Filled 2012-05-08 (×8): qty 1

## 2012-05-08 MED ORDER — ONDANSETRON 4 MG PO TBDP: 4.0000 mg | ORAL_TABLET | Freq: Four times a day (QID) | ORAL | Status: DC | PRN

## 2012-05-08 MED ORDER — THIAMINE HCL 100 MG/ML IJ SOLN: 100.0000 mg | Freq: Once | INTRAMUSCULAR | Status: DC

## 2012-05-08 MED ORDER — CHLORDIAZEPOXIDE HCL 25 MG PO CAPS
25.0000 mg | ORAL_CAPSULE | Freq: Four times a day (QID) | ORAL | Status: AC
  Administered 2012-05-08 (×2): 25 mg via ORAL
  Filled 2012-05-08 (×3): qty 1

## 2012-05-08 MED ORDER — ACETAMINOPHEN 325 MG PO TABS: 650.0000 mg | ORAL_TABLET | Freq: Four times a day (QID) | ORAL | Status: DC | PRN

## 2012-05-08 MED ORDER — MAGNESIUM HYDROXIDE 400 MG/5ML PO SUSP: 30.0000 mL | Freq: Every day | ORAL | Status: DC | PRN

## 2012-05-08 MED ORDER — METHOCARBAMOL 500 MG PO TABS
500.0000 mg | ORAL_TABLET | Freq: Three times a day (TID) | ORAL | Status: DC | PRN
  Administered 2012-05-08 – 2012-05-10 (×4): 500 mg via ORAL
  Filled 2012-05-08 (×4): qty 1

## 2012-05-08 MED ORDER — CLONIDINE HCL 0.1 MG PO TABS
0.1000 mg | ORAL_TABLET | Freq: Four times a day (QID) | ORAL | Status: AC
  Administered 2012-05-08 – 2012-05-10 (×7): 0.1 mg via ORAL
  Filled 2012-05-08 (×9): qty 1

## 2012-05-08 MED ORDER — VITAMIN B-1 100 MG PO TABS
100.0000 mg | ORAL_TABLET | Freq: Every day | ORAL | Status: DC
  Administered 2012-05-09 – 2012-05-12 (×4): 100 mg via ORAL
  Filled 2012-05-08 (×6): qty 1

## 2012-05-08 MED ORDER — ONDANSETRON 4 MG PO TBDP
4.0000 mg | ORAL_TABLET | Freq: Four times a day (QID) | ORAL | Status: AC | PRN
  Administered 2012-05-08: 4 mg via ORAL

## 2012-05-08 MED ORDER — CHLORDIAZEPOXIDE HCL 25 MG PO CAPS
25.0000 mg | ORAL_CAPSULE | ORAL | Status: AC
  Administered 2012-05-10 (×2): 25 mg via ORAL
  Filled 2012-05-08 (×3): qty 1

## 2012-05-08 MED ORDER — DICYCLOMINE HCL 20 MG PO TABS: 20.0000 mg | ORAL_TABLET | Freq: Four times a day (QID) | ORAL | Status: DC | PRN

## 2012-05-08 MED ORDER — ADULT MULTIVITAMIN W/MINERALS CH
1.0000 | ORAL_TABLET | Freq: Every day | ORAL | Status: DC
  Administered 2012-05-08 – 2012-05-12 (×5): 1 via ORAL
  Filled 2012-05-08 (×7): qty 1

## 2012-05-08 MED ORDER — ONDANSETRON 8 MG PO TBDP
ORAL_TABLET | ORAL | Status: AC
  Filled 2012-05-08: qty 1

## 2012-05-08 MED ORDER — CLONIDINE HCL 0.1 MG PO TABS
0.1000 mg | ORAL_TABLET | ORAL | Status: AC
  Administered 2012-05-10 – 2012-05-11 (×2): 0.1 mg via ORAL
  Filled 2012-05-08 (×4): qty 1

## 2012-05-08 MED ORDER — CHLORDIAZEPOXIDE HCL 25 MG PO CAPS
50.0000 mg | ORAL_CAPSULE | Freq: Once | ORAL | Status: AC
Start: 2012-05-08 — End: 2012-05-08
  Administered 2012-05-08: 50 mg via ORAL
  Filled 2012-05-08: qty 2

## 2012-05-08 MED ORDER — HYDROXYZINE HCL 25 MG PO TABS: 25.0000 mg | ORAL_TABLET | Freq: Four times a day (QID) | ORAL | Status: DC | PRN

## 2012-05-08 MED ORDER — CHLORDIAZEPOXIDE HCL 25 MG PO CAPS: 25.0000 mg | ORAL_CAPSULE | Freq: Four times a day (QID) | ORAL | Status: AC | PRN

## 2012-05-08 MED ORDER — CHLORDIAZEPOXIDE HCL 25 MG PO CAPS
25.0000 mg | ORAL_CAPSULE | Freq: Every day | ORAL | Status: AC
  Administered 2012-05-11: 25 mg via ORAL
  Filled 2012-05-08: qty 1

## 2012-05-08 MED ORDER — CLONIDINE HCL 0.1 MG PO TABS
0.1000 mg | ORAL_TABLET | Freq: Every day | ORAL | Status: DC
  Administered 2012-05-12: 0.1 mg via ORAL
  Filled 2012-05-08 (×2): qty 1

## 2012-05-08 MED ORDER — ALUM & MAG HYDROXIDE-SIMETH 200-200-20 MG/5ML PO SUSP: 30.0000 mL | ORAL | Status: DC | PRN

## 2012-05-08 NOTE — BH Assessment (Signed)
[facility] Assessment Progress Note    10/08/12 Patient accepted by Dr. Inocencio Homes to Dr. Dub Mikes, Room 302-1.

## 2012-05-08 NOTE — ED Notes (Signed)
[facility] called for transport to [facility].

## 2012-05-08 NOTE — BH Assessment (Signed)
[facility] Assessment Progress Note      05/07/2012 Faxed referral to [facility], spoke with Patsy Lager, who is going to contact doctor to run patient.  Contacted [facility]; they stated they may have beds possibly tomorrow. Faxed referral. Contacted [facility]; she stated they may have beds possibly tomorrow; faxed referral. [facility]; she stated they didn't have beds currently, to try back tomorrow after they complete discharges.  Contacted [facility]; unable to get anyone to answer the phone.  Awaiting call back from referrals at this time.

## 2012-05-08 NOTE — ED Provider Notes (Signed)
3 Advised by Sunny Schlein, [facility] that patient has been accepted by Dr. Dub Mikes.  Nicoletta Dress. Colon Branch, MD 05/08/12 1610

## 2012-05-08 NOTE — Clinical Social Work Note (Signed)
[facility] Group Notes: (Clinical Social Work)   05/08/2012      Type of Therapy:  Group Therapy   Participation Level:  Did Not Attend    Ambrose Mantle, LCSW 05/08/2012, 12:11 PM

## 2012-05-08 NOTE — [facility] Suicide Risk Assessment (Addendum)
Suicide Risk Assessment  Admission Assessment     Information obtained from:   Demographic factors:   Current Mental Status per Physician Patient denies suicidal or homicidal ideation, hallucinations, illusions, or delusions. Patient engages with good eye contact, is able to focus adequately in a one to one setting, and has clear goal directed thoughts. Patient speaks with a natural conversational volume, rate, and tone. Anxiety was reported at 4 or 5 on a scale of 1 the least and 10 the most. Depression was reported at 6 or 7 on the same scale. Pain is 7/10 back and down leg Patient is oriented times 4, recent and remote memory intact. Judgement: limited by his mental illness and his addictive thinking. Insight: limited by his mental illness and his addictive thinking. Pt seems to view his condition as being a victim of everything.  Loss Factors:   Historical Factors:   Risk Reduction Factors:    CLINICAL FACTORS:   Depression:   Comorbid alcohol abuse/dependence Alcohol/Substance Abuse/Dependencies  COGNITIVE FEATURES THAT CONTRIBUTE TO RISK:  Closed-mindedness Loss of executive function Polarized thinking Thought constriction (tunnel vision)    SUICIDE RISK:   Moderate:  Frequent suicidal ideation with limited intensity, and duration, some specificity in terms of plans, no associated intent, good self-control, limited dysphoria/symptomatology, some risk factors present, and identifiable protective factors, including available and accessible social support.  PLAN OF CARE: 1 Admit for crisis management and stabilization.  Estimate length of stay is 3 to 5 days.  2. Medication management to reduce current symptoms to base line and improve the patient's overall level of functioning.  3. Treat health problems as indicated:  4. Develop treatment plan to decrease risk of relapse upon discharge and the need for readmission.  5. Psycho-social education regarding relapse  prevention and self-care.  6. Review and reinstate any pertinent home medication for other health issues where appropriate.  7. Call for Consult with [facility] for additional specialty patient services as needed.  I certify that inpatient services furnished can reasonably be expected to improve the patient's condition.  Orson Aloe, MD, [facility] 05/08/2012 3:13 PM

## 2012-05-08 NOTE — BH Assessment (Signed)
[facility] Assessment Progress Note      10/08/12 Patient did not sign release of information or no opiates form. He is under IVC.  Completed [employer]; authorized for 3 days, by Loraine Leriche, authorization number is [id number].   Shon Baton, MSW, LCSW, LCASA, CSW-G
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
## 2012-05-08 NOTE — Progress Notes (Signed)
Patient has been accepted at [facility] by Dr. Hilton Cork to Dr. Dub Mikes; room 307.2

## 2012-05-08 NOTE — Tx Team (Addendum)
Initial Interdisciplinary Treatment Plan  PATIENT STRENGTHS: (choose at least two) General fund of knowledge Supportive family/friends  PATIENT STRESSORS: Financial difficulties Health problems Substance abuse   PROBLEM LIST: Problem List/Patient Goals Date to be addressed Date deferred Reason deferred Estimated date of resolution  Substance Abuse                                                       DISCHARGE CRITERIA:  Medical problems require only outpatient monitoring Withdrawal symptoms are absent or subacute and managed without 24-hour nursing intervention  PRELIMINARY DISCHARGE PLAN: Attend aftercare/continuing care group Return to previous living arrangement  PATIENT/FAMIILY INVOLVEMENT: This treatment plan has been presented to and reviewed with the patient, Larry Hoover, and/or family member, .  The patient and family have been given the opportunity to ask questions and make suggestions.  Andrena Mews 05/08/2012, 12:39 PM
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
## 2012-05-08 NOTE — BH Assessment (Deleted)
Suicide Risk Assessment  Admission Assessment     Information obtained from:   Demographic factors:   Current Mental Status per Physician Patient denies suicidal or homicidal ideation, hallucinations, illusions, or delusions. Patient engages with good eye contact, is able to focus adequately in a one to one setting, and has clear goal directed thoughts. Patient speaks with a natural conversational volume, rate, and tone. Anxiety was reported at 4 or 5 on a scale of 1 the least and 10 the most. Depression was reported at 6 or 7 on the same scale. Pain is 7/10 back and down leg Patient is oriented times 4, recent and remote memory intact. Judgement: limited by his mental illness and his addictive thinking. Insight: limited by his mental illness and his addictive thinking.  Loss Factors:   Historical Factors:   Risk Reduction Factors:    CLINICAL FACTORS:   Depression:   Comorbid alcohol abuse/dependence Alcohol/Substance Abuse/Dependencies  COGNITIVE FEATURES THAT CONTRIBUTE TO RISK:  Closed-mindedness Loss of executive function Polarized thinking Thought constriction (tunnel vision)    SUICIDE RISK:   Moderate:  Frequent suicidal ideation with limited intensity, and duration, some specificity in terms of plans, no associated intent, good self-control, limited dysphoria/symptomatology, some risk factors present, and identifiable protective factors, including available and accessible social support.  PLAN OF CARE: 1 Admit for crisis management and stabilization.  Estimate length of stay is 3 to 5 days.  2. Medication management to reduce current symptoms to base line and improve the patient's overall level of functioning.  3. Treat health problems as indicated:  4. Develop treatment plan to decrease risk of relapse upon discharge and the need for readmission.  5. Psycho-social education regarding relapse prevention and self-care.  6. Review and reinstate any pertinent home  medication for other health issues where appropriate.  7. Call for Consult with [facility] for additional specialty patient services as needed.  I certify that inpatient services furnished can reasonably be expected to improve the patient's condition.  Orson Aloe, MD, [facility] 05/08/2012 3:13 PM
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
## 2012-05-08 NOTE — ED Notes (Signed)
Attempted to call report but facility stated to have him transferred after 8am. Will notify next shift.

## 2012-05-09 DIAGNOSIS — F1994 Other psychoactive substance use, unspecified with psychoactive substance-induced mood disorder: Secondary | ICD-10-CM

## 2012-05-09 DIAGNOSIS — F1910 Other psychoactive substance abuse, uncomplicated: Secondary | ICD-10-CM

## 2012-05-09 MED ORDER — DULOXETINE HCL 30 MG PO CPEP
30.0000 mg | ORAL_CAPSULE | Freq: Every day | ORAL | Status: DC
  Administered 2012-05-09: 30 mg via ORAL
  Filled 2012-05-09 (×4): qty 1

## 2012-05-09 MED ORDER — TRAZODONE HCL 100 MG PO TABS
100.0000 mg | ORAL_TABLET | Freq: Every day | ORAL | Status: DC
  Administered 2012-05-09 – 2012-05-11 (×3): 100 mg via ORAL
  Filled 2012-05-09 (×3): qty 1
  Filled 2012-05-09: qty 14
  Filled 2012-05-09: qty 1

## 2012-05-09 MED ORDER — LIDOCAINE 5 % EX PTCH
1.0000 | MEDICATED_PATCH | Freq: Every day | CUTANEOUS | Status: DC
  Administered 2012-05-09 – 2012-05-12 (×4): 1 via TRANSDERMAL
  Filled 2012-05-09 (×5): qty 1
  Filled 2012-05-09: qty 14

## 2012-05-09 NOTE — Progress Notes (Signed)
[facility] MD Progress Note  05/09/2012 10:22 AM Larry Hoover  MRN:  [id number] Subjective:  Larry Hoover is complaining of low back pain that is a result from a "crushed vertebrae between L3 and L5.". He reports that he uses heat packs to relieve the pain, but that only helpes minimally. He reports that he has been on Neurontin in the past for seizures, but it gave him no relief for his pain. He seems to be very focused on opiate pain relievers. He denies that he is having any depression, but rates his anxiety as a 10 on a scale of 1-10 where 10 is the worst. He also reports suicidal thoughts of "blowing my head off." He denies any homicidal ideation or auditory or visual hallucinations. He complains of poor sleep, and states that he went to sleep around 11 or 12, and then was up at 2 AM for the rest of the night. He denies any cravings or withdrawal symptoms.  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 Cancer   . Seizures     ADL's:  Impaired  Sleep: Poor  Appetite:  Fair  Suicidal Ideation:  Patient endorses thoughts with a plan to shoot himself in the head if he doesn't get any help. Homicidal Ideation:  Patient denies any thought, plan, or intent AEB (as evidenced by):  Psychiatric Specialty Exam: Review of Systems  Constitutional: Negative.   HENT: Negative.   Eyes: Negative.   Respiratory: Negative.   Cardiovascular: Negative.   Gastrointestinal: Positive for abdominal pain.  Genitourinary: Negative.   Musculoskeletal: Positive for myalgias and back pain.  Skin: Negative.   Neurological: Negative.   Endo/Heme/Allergies: Negative.   Psychiatric/Behavioral: Positive for substance abuse. Negative for suicidal ideas and hallucinations. The patient is nervous/anxious and has insomnia.     Blood pressure 125/77, pulse 87, temperature 97.5 F  (36.4 C), temperature source Oral, resp. rate 20, height 5' 8.5" (1.74 m), weight 84.369 kg (186 lb), SpO2 97.00%.Body mass index is 27.87 kg/(m^2).  General Appearance: Casual  Eye Contact::  Good  Speech:  Clear and Coherent  Volume:  Normal  Mood:  Anxious  Affect:  Congruent  Thought Process:  Circumstantial and Tangential  Orientation:  Full (Time, Place, and Person)  Thought Content:  WDL  Suicidal Thoughts:  Yes.  with intent/plan  Homicidal Thoughts:  No  Memory:  Immediate;   Good Recent;   Good Remote;   Good  Judgement:  Impaired  Insight:  Lacking  Psychomotor Activity:  Normal  Concentration:  Good  Recall:  Good  Akathisia:  No  Handed:  Right  AIMS (if indicated):     Assets:  Communication Skills Resilience  Sleep:  Number of Hours: 2.75   Current Medications: Current Facility-Administered Medications  Medication Dose Route Frequency Provider Last Rate Last Dose  . acetaminophen (TYLENOL) tablet 650 mg  650 mg Oral Q6H PRN Larena Sox, MD      . alum & mag hydroxide-simeth (MAALOX/MYLANTA) 200-200-20 MG/5ML suspension 30 mL  30 mL Oral Q4H PRN Larena Sox, MD      . chlordiazePOXIDE (LIBRIUM) capsule 25 mg  25 mg Oral Q6H PRN Larena Sox, MD      . chlordiazePOXIDE (LIBRIUM) capsule 25 mg  25 mg Oral TID Larena Sox, MD   25 mg at 05/09/12 [phone]  Followed by  . [START ON 05/10/2012] chlordiazePOXIDE (LIBRIUM) capsule 25 mg  25 mg Oral BH-qamhs Larena Sox, MD       Followed by  . [START ON 05/11/2012] chlordiazePOXIDE (LIBRIUM) capsule 25 mg  25 mg Oral Daily Larena Sox, MD      . cloNIDine (CATAPRES) tablet 0.1 mg  0.1 mg Oral QID Larena Sox, MD   0.1 mg at 05/09/12 0753   Followed by  . [START ON 05/10/2012] cloNIDine (CATAPRES) tablet 0.1 mg  0.1 mg Oral BH-qamhs Larena Sox, MD       Followed by  . [START ON 05/12/2012] cloNIDine (CATAPRES) tablet 0.1 mg  0.1 mg Oral QAC breakfast Larena Sox, MD      .  dicyclomine (BENTYL) tablet 20 mg  20 mg Oral Q6H PRN Larena Sox, MD      . hydrOXYzine (ATARAX/VISTARIL) tablet 25 mg  25 mg Oral Q6H PRN Larena Sox, MD      . loperamide (IMODIUM) capsule 2-4 mg  2-4 mg Oral PRN Larena Sox, MD      . magnesium hydroxide (MILK OF MAGNESIA) suspension 30 mL  30 mL Oral Daily PRN Larena Sox, MD      . methocarbamol (ROBAXIN) tablet 500 mg  500 mg Oral Q8H PRN Larena Sox, MD   500 mg at 05/09/12 0754  . multivitamin with minerals tablet 1 tablet  1 tablet Oral Daily Larena Sox, MD   1 tablet at 05/09/12 0754  . naproxen (NAPROSYN) tablet 500 mg  500 mg Oral BID PRN Larena Sox, MD   500 mg at 05/09/12 0315  . ondansetron (ZOFRAN-ODT) disintegrating tablet 4 mg  4 mg Oral Q6H PRN Larena Sox, MD   4 mg at 05/08/12 1811  . thiamine (B-1) injection 100 mg  100 mg Intramuscular Once Larena Sox, MD      . thiamine (VITAMIN B-1) tablet 100 mg  100 mg Oral Daily Larena Sox, MD   100 mg at 05/09/12 0754    Lab Results:  Results for orders placed during the hospital encounter of 05/07/12 (from the past 48 hour(s))  CBC     Status: Abnormal   Collection Time    05/07/12  6:41 PM      Result Value Range   WBC 11.0 (*) 4.0 - 10.5 K/uL   RBC 5.52  4.22 - 5.81 MIL/uL   Hemoglobin 17.8 (*) 13.0 - 17.0 g/dL   HCT 40.9  81.1 - 91.4 %   MCV 90.4  78.0 - 100.0 fL   MCH 32.2  26.0 - 34.0 pg   MCHC 35.7  30.0 - 36.0 g/dL   RDW 78.2  95.6 - 21.3 %   Platelets 244  150 - 400 K/uL  COMPREHENSIVE METABOLIC PANEL     Status: Abnormal   Collection Time    05/07/12  6:41 PM      Result Value Range   Sodium 136  135 - 145 mEq/L   Potassium 3.5  3.5 - 5.1 mEq/L   Chloride 96  96 - 112 mEq/L   CO2 27  19 - 32 mEq/L   Glucose, Bld 105 (*) 70 - 99 mg/dL   BUN 20  6 - 23 mg/dL   Creatinine, Ser 0.86  0.50 - 1.35 mg/dL   Calcium 9.7  8.4 - 57.8 mg/dL   Total Protein 8.1  6.0 - 8.3 g/dL  Albumin 4.7  3.5 - 5.2 g/dL   AST  17  0 - 37 U/L   ALT 17  0 - 53 U/L   Alkaline Phosphatase 86  39 - 117 U/L   Total Bilirubin 0.4  0.3 - 1.2 mg/dL   GFR calc non Af Amer 80 (*) >90 mL/min   GFR calc Af Amer >90  >90 mL/min   Comment:            The eGFR has been calculated     using the CKD EPI equation.     This calculation has not been     validated in all clinical     situations.     eGFR's persistently     <90 mL/min signify     possible Chronic Kidney Disease.  ETHANOL     Status: None   Collection Time    05/07/12  6:41 PM      Result Value Range   Alcohol, Ethyl (B) <11  0 - 11 mg/dL   Comment:            LOWEST DETECTABLE LIMIT FOR     SERUM ALCOHOL IS 11 mg/dL     FOR MEDICAL PURPOSES ONLY  URINE RAPID DRUG SCREEN (HOSP PERFORMED)     Status: Abnormal   Collection Time    05/07/12  7:22 PM      Result Value Range   Opiates POSITIVE (*) NONE DETECTED   Cocaine NONE DETECTED  NONE DETECTED   Benzodiazepines POSITIVE (*) NONE DETECTED   Amphetamines NONE DETECTED  NONE DETECTED   Tetrahydrocannabinol POSITIVE (*) NONE DETECTED   Barbiturates NONE DETECTED  NONE DETECTED   Comment:            DRUG SCREEN FOR MEDICAL PURPOSES     ONLY.  IF CONFIRMATION IS NEEDED     FOR ANY PURPOSE, NOTIFY LAB     WITHIN 5 DAYS.                LOWEST DETECTABLE LIMITS     FOR URINE DRUG SCREEN     Drug Class       Cutoff (ng/mL)     Amphetamine      1000     Barbiturate      200     Benzodiazepine   200     Tricyclics       300     Opiates          300     Cocaine          300     THC              50    Physical Findings: AIMS: Facial and Oral Movements Muscles of Facial Expression: None, normal Lips and Perioral Area: None, normal Jaw: None, normal Tongue: None, normal,Extremity Movements Upper (arms, wrists, hands, fingers): None, normal Lower (legs, knees, ankles, toes): None, normal, Trunk Movements Neck, shoulders, hips: None, normal, Overall Severity Severity of abnormal movements (highest  score from questions above): None, normal Incapacitation due to abnormal movements: None, normal Patient's awareness of abnormal movements (rate only patient's report): No Awareness, Dental Status Current problems with teeth and/or dentures?: No Does patient usually wear dentures?: No  CIWA:  CIWA-Ar Total: 2 COWS:  COWS Total Score: 4  Treatment Plan Summary: Daily contact with patient to assess and evaluate symptoms and progress in treatment Medication management  Plan: We'll start him on Cymbalta for  anxiety and depression with hopes that he may get some pain relief. We will used Lidoderm patches to be applied to his back for pain relief. We'll start him on trazodone at bedtime for sleep. He is encouraged to do some stretching exercises to relieve pain in his lower back.  Medical Decision Making Problem Points:  Established problem, stable/improving (1), Review of last therapy session (1) and Review of psycho-social stressors (1) Data Points:  Review and summation of old records (2) Review of medication regiment & side effects (2) Review of new medications or change in dosage (2)  I certify that inpatient services furnished can reasonably be expected to improve the patient's condition.   Larry Hoover 05/09/2012, 10:22 AM

## 2012-05-09 NOTE — Clinical Social Work Note (Signed)
[facility] Group Notes: (Clinical Social Work)   05/09/2012      Type of Therapy:  Group Therapy   Participation Level:  Did Not Attend    Ambrose Mantle, LCSW 05/09/2012, 12:45 PM

## 2012-05-09 NOTE — Progress Notes (Signed)
Writer spoke with patient at medication window where he reports pain in his back and a headache. Patient request Futures trader informed him of medication available for pain. Patient received naproxen. Patient reports to writer that he has been unable to sleep and his pain in his back is so bad that he hopes he doesn't have a seizure. Writer offered encouragement that the medications will help to alleviate some of the pain he is experiencing and he can speak with the doctor on tomorrow concerning his pain. Patient denies si/hi/a/v hallucinations. Safety maintained on unit, will continue to monitor.

## 2012-05-09 NOTE — Progress Notes (Signed)
Psychoeducational Group Note  Date:  05/09/2012 Time:  2100 Group Topic/Focus:  wrap up group  Participation Level: Did Not Attend  Participation Quality:  Not Applicable  Affect:  Not Applicable  Cognitive:  Not Applicable  Insight:  Not Applicable  Engagement in Group: Not Applicable  Additional Comments: Pt was invited to attend group and pt responded, "I don't go to AA".  When this writer informed pt that AA was unable come this evening and there would be a group talking about how the day went, pt reported "nah I will just go to my room".  Shelah Lewandowsky 05/09/2012, 1:05 AM

## 2012-05-09 NOTE — Progress Notes (Signed)
I certify that inpatient services furnished can reasonably be expected to improve the patient's condition. Dickson Kostelnik, MD, [facility]  

## 2012-05-09 NOTE — Progress Notes (Signed)
D   Pt continues to complain of pain in his back  And needing medications  He is pleasant and presently denies suicidal ideation   He also complains of not being able to sleep but did sleep for 2.5 hours last night but would like to sleep more A   Encouraged pt to discuss sleeplessness with doctor   Discussed relaxation techniques with pt  Verbal support given  Medications administered and effectiveness monitored Q 15 min checks R   Pt safe at present

## 2012-05-09 NOTE — Progress Notes (Signed)
Patient came to medication window and request robaxin for back pain he rated a 7. Patient inquired as to when he would receive another lidocaine patch and was informed of time. Patient reports that he has taken a warm shower and that helped relieve his pain some also. Patient attended AA group and has been in the dayroom interacting appropriately with peers. Patient is concerned about his disability being approved and is interested in going to a pain clinic. Support and encouragement offered, no signs/symptoms of withdrawal noted. Safety maintained with 15 min checks, will continue to monitor. Denies si/hi/a/v hallucinations. Patient reports that he was experiencing pain before admission and reports that he made the comment that he could blow his head off because of his pain and he ended up here.

## 2012-05-10 NOTE — Progress Notes (Addendum)
Adult Psychoeducational Group Note  Date:  05/08/2012 Time:  2:15 PM  Group Topic/Focus:  Goals Group:   The focus of this group is to help patients establish daily goals to achieve during treatment and discuss how the patient can incorporate goal setting into their daily lives to aide in recovery.  Participation Level:  Did Not Attend  Participation Quality:  Did not attend  Affect:  Did not attend  Cognitive:  Did not attend  Insight: Lacking  Engagement in Group:  None  Modes of Intervention:  Clarification, Discussion, Education, Problem-solving, Socialization and Support  Additional Comments:  Did not attend.  Pixie Casino Lovelady 05/08/2012, 3:30 PM

## 2012-05-10 NOTE — Progress Notes (Signed)
[facility] MD Progress Note  05/10/2012 6:40 PM Larry Hoover  MRN:  [id number] Subjective:  States that his pain causes him to want to kill himself. States that if he does not get help for it he is going to do it. He states he needs surgery. He was prescribed Xanax and Oxycodone but he is not getting it prescribed anymore by his physician. He gets them from family members or occasionally through urgent care Diagnosis:  Opioid, Benzodiazepine Dependence  ADL's:  Intact  Sleep: Poor  Appetite:  Poor  Suicidal Ideation:  Plan:  ideas no plans Intent:  denies Means:  denies Homicidal Ideation:  Plan:  denies Intent:  denies Means:  denies AEB (as evidenced by):  Psychiatric Specialty Exam: Review of Systems  Constitutional: Negative.   Eyes: Negative.   Respiratory: Negative.   Cardiovascular: Negative.   Gastrointestinal: Negative.   Genitourinary: Negative.   Musculoskeletal: Positive for myalgias, back pain and joint pain.  Skin: Negative.   Neurological: Negative.   Endo/Heme/Allergies: Negative.   Psychiatric/Behavioral: Positive for depression, suicidal ideas and substance abuse. The patient is nervous/anxious and has insomnia.     Blood pressure 120/80, pulse 80, temperature 97.1 F (36.2 C), temperature source Oral, resp. rate 16, height 5' 8.5" (1.74 m), weight 84.369 kg (186 lb), SpO2 97.00%.Body mass index is 27.87 kg/(m^2).  General Appearance: Disheveled  Eye Solicitor::  Fair  Speech:  Clear and Coherent, Slow and not spontaneous  Volume:  Decreased  Mood:  Angry, Depressed and Irritable in pain  Affect:  Restricted  Thought Process:  Coherent and Goal Directed  Orientation:  Full (Time, Place, and Person)  Thought Content:  somatically focused  Suicidal Thoughts:  Yes.  without intent/plan  Homicidal Thoughts:  No  Memory:  Immediate;   Fair Recent;   Fair Remote;   Fair  Judgement:  Fair  Insight:  Shallow  Psychomotor Activity:  Restlessness   Concentration:  Fair  Recall:  Fair  Akathisia:  No  Handed:  Right  AIMS (if indicated):     Assets:  Desire for Improvement  Sleep:  Number of Hours: 4   Current Medications: Current Facility-Administered Medications  Medication Dose Route Frequency Provider Last Rate Last Dose  . acetaminophen (TYLENOL) tablet 650 mg  650 mg Oral Q6H PRN Larena Sox, MD      . alum & mag hydroxide-simeth (MAALOX/MYLANTA) 200-200-20 MG/5ML suspension 30 mL  30 mL Oral Q4H PRN Larena Sox, MD      . chlordiazePOXIDE (LIBRIUM) capsule 25 mg  25 mg Oral Q6H PRN Larena Sox, MD      . chlordiazePOXIDE (LIBRIUM) capsule 25 mg  25 mg Oral BH-qamhs Larena Sox, MD   25 mg at 05/10/12 8295   Followed by  . [START ON 05/11/2012] chlordiazePOXIDE (LIBRIUM) capsule 25 mg  25 mg Oral Daily Larena Sox, MD      . cloNIDine (CATAPRES) tablet 0.1 mg  0.1 mg Oral BH-qamhs Larena Sox, MD   0.1 mg at 05/10/12 6213   Followed by  . [START ON 05/12/2012] cloNIDine (CATAPRES) tablet 0.1 mg  0.1 mg Oral QAC breakfast Larena Sox, MD      . dicyclomine (BENTYL) tablet 20 mg  20 mg Oral Q6H PRN Larena Sox, MD      . DULoxetine (CYMBALTA) DR capsule 30 mg  30 mg Oral Daily Jorje Guild, PA-C   30 mg at 05/09/12 1209  . hydrOXYzine (  ATARAX/VISTARIL) tablet 25 mg  25 mg Oral Q6H PRN Larena Sox, MD      . lidocaine (LIDODERM) 5 % 1 patch  1 patch Transdermal QAC breakfast Jorje Guild, PA-C   1 patch at 05/10/12 [phone]  . loperamide (IMODIUM) capsule 2-4 mg  2-4 mg Oral PRN Larena Sox, MD      . magnesium hydroxide (MILK OF MAGNESIA) suspension 30 mL  30 mL Oral Daily PRN Larena Sox, MD      . methocarbamol (ROBAXIN) tablet 500 mg  500 mg Oral Q8H PRN Larena Sox, MD   500 mg at 05/10/12 1154  . multivitamin with minerals tablet 1 tablet  1 tablet Oral Daily Larena Sox, MD   1 tablet at 05/10/12 9604  . naproxen (NAPROSYN) tablet 500 mg  500 mg Oral BID PRN Larena Sox, MD   500 mg at 05/10/12 0300  . ondansetron (ZOFRAN-ODT) disintegrating tablet 4 mg  4 mg Oral Q6H PRN Larena Sox, MD   4 mg at 05/08/12 1811  . thiamine (B-1) injection 100 mg  100 mg Intramuscular Once Larena Sox, MD      . thiamine (VITAMIN B-1) tablet 100 mg  100 mg Oral Daily Larena Sox, MD   100 mg at 05/10/12 0821  . traZODone (DESYREL) tablet 100 mg  100 mg Oral QHS Jorje Guild, PA-C   100 mg at 05/09/12 2152    Lab Results: No results found for this or any previous visit (from the past 48 hour(s)).  Physical Findings: AIMS: Facial and Oral Movements Muscles of Facial Expression: None, normal Lips and Perioral Area: None, normal Jaw: None, normal Tongue: None, normal,Extremity Movements Upper (arms, wrists, hands, fingers): None, normal Lower (legs, knees, ankles, toes): None, normal, Trunk Movements Neck, shoulders, hips: None, normal, Overall Severity Severity of abnormal movements (highest score from questions above): None, normal Incapacitation due to abnormal movements: None, normal Patient's awareness of abnormal movements (rate only patient's report): No Awareness, Dental Status Current problems with teeth and/or dentures?: No Does patient usually wear dentures?: No  CIWA:  CIWA-Ar Total: 4 COWS:  COWS Total Score: 0  Treatment Plan Summary: Daily contact with patient to assess and evaluate symptoms and progress in treatment Medication management  Plan: Supportive approach/coping skills/relapse prevention           Complete detox           Reassess co morbidities  Medical Decision Making Problem Points:  Review of psycho-social stressors (1) Data Points:  Review of medication regiment & side effects (2)  I certify that inpatient services furnished can reasonably be expected to improve the patient's condition.   Tracina Beaumont A 05/10/2012, 6:40 PM

## 2012-05-10 NOTE — Progress Notes (Signed)
Adult Psychoeducational Group Note  Date:  05/10/2012 Time: 11:00AM Group Topic/Focus:  Self Care:   The focus of this group is to help patients understand the importance of self-care in order to improve or restore emotional, physical, spiritual, interpersonal, and financial health. Self Esteem Action Plan:   The focus of this group is to help patients create a plan to continue to build self-esteem after discharge.  Participation Level:  Active  Participation Quality:  Appropriate and Attentive  Affect:  Appropriate  Cognitive:  Alert and Appropriate  Insight: Appropriate  Engagement in Group:  Engaged  Modes of Intervention:  Discussion  Additional Comments:  Pt. Was attentive and appropriate during today's group discussion. Pt was able to discuss Wellness containing to Self Care. Pt. Was able to complete Self Care Assessment. Pt stated that he enjoys spending time with family and pets. However there are some areas he is willing to improve in like get enough sleep and journal.  Bing Plume D 05/10/2012, 1:24 PM

## 2012-05-10 NOTE — Tx Team (Signed)
Interdisciplinary Treatment Plan Update (Adult)  Date: 05/10/2012  Time Reviewed: 10:03 AM   Progress in Treatment: Attending groups: Yes Participating in groups: Yes Taking medication as prescribed:  Yes Tolerating medication:  Yes Family/Significant othe contact made: No Patient understands diagnosis: Yes Discussing patient identified problems/goals with staff: Yes Medical problems stabilized or resolved:  Yes Denies suicidal/homicidal ideation: No Patient has not harmed self or Others: Yes  New problem(s) identified: None Identified  Discharge Plan or Barriers:  CSW is assessing for appropriate referrals. Patient reports he needs referrals for surgery and has no mental health needs  Additional comments: N/A  Reason for Continuation of Hospitalization: Anxiety Depression Medication stabilization Suicidal ideation Withdrawal symptoms   Estimated length of stay: 3-5 days  For review of initial/current patient goals, please see plan of care.  Attendees: Patient:     Family:     Physician:  Geoffery Lyons 05/10/2012 10:03 AM   Nursing:   Roswell Miners, RN 05/10/2012 10:03 AM   Clinical Social Worker Ronda Fairly 05/10/2012 10:03 AM   Other:  Robbie Louis, RN 05/10/2012 10:03 AM   Other:  Serena Colonel, NP 05/10/2012 10:03 AM   Other:     Other:      Scribe for Treatment Team:   Carney Bern, LCSWA  05/10/2012 10:03 AM

## 2012-05-10 NOTE — Progress Notes (Signed)
Patient ID: Larry Hoover, male   DOB: [birth year], 42 y.o.   MRN: [id number] D: Pt. Very talkative, "been on xanax since 2001, the doctor had me on it" "I need three surgeries, need hip surgery, need kidney surgery, need back surgery" "got crushed vertebrae between 3-4 th vertebrae", "hurt all the time" "I went to ED they said they was going to find me some place to go get some help" " what they found in me was hydrocodone and xanax" "see me and my GF lost 3 children" Pt. Stutters during conversation. "they gave me something today I think it was cymbalta, made me high as cudder brown, I couldn't go to dinner" A: Writer introduced self to client and review med administration times, encouraged pt. To verbalized concerns. Staff will monitor q43min for safety. R: Pt. Is safe on the unit. Pt. Attended group.

## 2012-05-10 NOTE — Progress Notes (Signed)
[facility] LCSW Aftercare Discharge Planning Group Note  05/10/2012 8:45 AM  Participation Quality:  Sharing  Affect:  Flat  Cognitive: Alert and oriented  Insight:  Limited  Engagement in Group:  Limited  Modes of Intervention:  Calrification and exploration  Summary of Progress/Problems:  Pt endorses suicidall ideation and agreed to contract for safety.  On a scale of 1 to 10 with ten being the most ever experienced, the patient rates depression at a10 and anxiety at a 10. Patient reports he needs [place] to refer him for surgery that he has no SA or MH needs.  Patient reports he has weapons in the home and a history of a suicide attempt in 2007. Patient agrees to provide contact information for CSW to contact girlfriend.    Larry Hoover

## 2012-05-10 NOTE — Progress Notes (Signed)
[facility] LCSW Group Therapy  05/10/2012 11:08 PM  Type of Therapy:  Group Therapy 1:15 to 2:30 PM  Participation Level:  Minimal  Participation Quality: Physically Present, asleep for large portion of group  Affect: Flat  Cognitive: Oriented  Insight: None shared  Engagement in Therapy:  None  Modes of Intervention:  Discussion, Exploration, Limit setting  Summary of Progress/Problems:  Group discussion focused on what patient's see as their own obstacles to recovery.  Patient choose not to share during group today and slept for large porion of group.   Clide Dales

## 2012-05-11 DIAGNOSIS — F1120 Opioid dependence, uncomplicated: Secondary | ICD-10-CM

## 2012-05-11 DIAGNOSIS — F1110 Opioid abuse, uncomplicated: Secondary | ICD-10-CM

## 2012-05-11 DIAGNOSIS — F1310 Sedative, hypnotic or anxiolytic abuse, uncomplicated: Secondary | ICD-10-CM | POA: Diagnosis present

## 2012-05-11 DIAGNOSIS — F1320 Sedative, hypnotic or anxiolytic dependence, uncomplicated: Secondary | ICD-10-CM

## 2012-05-11 MED ORDER — CARBAMAZEPINE ER 200 MG PO TB12
200.0000 mg | ORAL_TABLET | Freq: Two times a day (BID) | ORAL | Status: DC
  Administered 2012-05-11 – 2012-05-12 (×2): 200 mg via ORAL
  Filled 2012-05-11: qty 28
  Filled 2012-05-11 (×3): qty 1
  Filled 2012-05-11: qty 28
  Filled 2012-05-11: qty 1

## 2012-05-11 MED ORDER — DULOXETINE HCL 30 MG PO CPEP
30.0000 mg | ORAL_CAPSULE | Freq: Every day | ORAL | Status: DC
  Administered 2012-05-11: 30 mg via ORAL
  Filled 2012-05-11 (×2): qty 1
  Filled 2012-05-11: qty 14

## 2012-05-11 MED ORDER — METHOCARBAMOL 500 MG PO TABS
500.0000 mg | ORAL_TABLET | Freq: Three times a day (TID) | ORAL | Status: DC
  Administered 2012-05-11 – 2012-05-12 (×2): 500 mg via ORAL
  Filled 2012-05-11 (×5): qty 1
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
## 2012-05-11 NOTE — Progress Notes (Signed)
Patient ID: Larry Hoover, male   DOB: November 11, 1970, 42 y.o.   MRN: [id number]  D: Pt denies SI/HI/AVh. Pt is pleasant and cooperative. Pt states talked to daughter and feels better, so ready to go home.  A: Pt was offered support and encouragement. Pt was given scheduled medications. Q 15 minute checks were done for safety.    R:. Pt is taking medication. Pt has no complaints.Pt receptive to treatment and safety maintained on unit.

## 2012-05-11 NOTE — [facility] Suicide Risk Assessment (Signed)
[facility] INPATIENT:  Family/Significant Other Suicide Prevention Education  Suicide Prevention Education:  Education Completed; Patient significant other, Remonia Richter at [phone], has been identified by the patient as the family member/significant other with whom the patient will be residing, and identified as the person(s) who will aid the patient in the event of a mental health crisis (suicidal ideations/suicide attempt).  With written consent from the patient, the family member/significant other has been provided the following suicide prevention education, prior to the and/or following the discharge of the patient.  The suicide prevention education provided includes the following:  Suicide risk factors  Suicide prevention and interventions  National Suicide Hotline telephone number  [facility] assessment telephone number  [facility] Emergency Assistance 911  [facility] and/or Residential Mobile Crisis Unit telephone number  Request made of family/significant other to:  Remove weapons (e.g., guns, rifles, knives), all items previously/currently identified as safety concern.  Significant other reports family friend will be removing the firearms from the home tonight 05/11/12 and she has already removed knives.   Remove drugs/medications (over-the-counter, prescriptions, illicit drugs), all items previously/currently identified as a safety concern.  The family member/significant other verbalizes understanding of the suicide prevention education information provided.  The family member/significant other agrees to remove the items of safety concern listed above.  Clide Dales 05/11/2012, 2:36 PM
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
## 2012-05-12 DIAGNOSIS — F1920 Other psychoactive substance dependence, uncomplicated: Secondary | ICD-10-CM

## 2012-05-12 DIAGNOSIS — F39 Unspecified mood [affective] disorder: Secondary | ICD-10-CM

## 2012-05-12 DIAGNOSIS — F329 Major depressive disorder, single episode, unspecified: Secondary | ICD-10-CM

## 2012-05-12 MED ORDER — CARBAMAZEPINE ER 200 MG PO TB12: 200.0000 mg | ORAL_TABLET | Freq: Two times a day (BID) | ORAL | Status: DC

## 2012-05-12 MED ORDER — TRAZODONE HCL 100 MG PO TABS: 100.0000 mg | ORAL_TABLET | Freq: Every day | ORAL | Status: DC

## 2012-05-12 MED ORDER — METHOCARBAMOL 500 MG PO TABS: 500.0000 mg | ORAL_TABLET | Freq: Three times a day (TID) | ORAL | Status: DC

## 2012-05-12 MED ORDER — LIDOCAINE 5 % EX PTCH: 1.0000 | MEDICATED_PATCH | Freq: Every day | CUTANEOUS | Status: DC

## 2012-05-12 MED ORDER — DULOXETINE HCL 30 MG PO CPEP: 30.0000 mg | ORAL_CAPSULE | Freq: Every day | ORAL | Status: DC

## 2012-05-12 MED ORDER — METHOCARBAMOL 500 MG PO TABS
500.0000 mg | ORAL_TABLET | Freq: Three times a day (TID) | ORAL | Status: DC
  Administered 2012-05-12: 500 mg via ORAL
  Filled 2012-05-12 (×2): qty 1
  Filled 2012-05-12: qty 20
  Filled 2012-05-12: qty 1
  Filled 2012-05-12 (×2): qty 20

## 2012-05-12 NOTE — Progress Notes (Signed)
Patient ID: Larry Hoover, male   DOB: June 05, 1970, 42 y.o.   MRN: [id number] Patient denies SI/HI and A/V hallucinations; patient discharged per physician order; patient received samples, prescriptions, and copy of AVS after it was reviewed; patient has no other concerns or questions at this time; patient left the unit ambulatory

## 2012-05-12 NOTE — Progress Notes (Signed)
Patient ID: Larry Hoover, male   DOB: 18-Dec-1970, 42 y.o.   MRN: [id number] He has been discharged home was picked up by a family member. He voiced understanding of discharge instruction and of follow up plan. All belongings were taken home with him . Norovirus  Prevention and control information explained to him and a copy was provided to him. He voiced understanding .

## 2012-05-12 NOTE — Progress Notes (Signed)
[facility] Adult Case Management Discharge Plan :  Will you be returning to the same living situation after discharge: Yes,  Home At discharge, do you have transportation home?:Yes,  friend Do you have the ability to pay for your medications:Yes,  if cost is manageable   Release of information consent forms completed and in the chart;  Patient's signature needed at discharge.  Patient to Follow up at: Follow-up Information   Follow up with [facility] On 05/14/2012. (Follow up at [facility], first visit on Friday 4/4 for initial assessment, go between the hours of 7:45 and 11AM. The earlier you arrive the earlier you will be seen)    Contact information:   [address] [place], [place] [address] [facility] [phone] FAX [phone]      Patient denies SI/HI:   Yes,  denies both    Safety Planning and Suicide Prevention discussed:  Yes,  with signioficant other  Clide Dales 05/12/2012, 11:53 AM

## 2012-05-12 NOTE — Discharge Summary (Addendum)
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   HENT: Negative.   Eyes: Negative.   Respiratory: Negative.   Cardiovascular: Negative.   Gastrointestinal: Negative.   Genitourinary: Negative.   Musculoskeletal: Positive for myalgias, back pain and joint pain.  Skin: Negative for itching and rash.  Neurological: Positive for seizures (Hx of).  Endo/Heme/Allergies: Negative.   Psychiatric/Behavioral: Positive for depression (Stabilized with medication prior to discharge) and substance abuse (Hx opiate and Benzodiazepine dependence). Negative for suicidal ideas, hallucinations and memory loss. The patient is nervous/anxious (Stabilized with medication prior to discharge) and has insomnia (Stabilized with medication prior to discharge).    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 Cancer   . Seizures    AXIS IV:  other psychosocial or environmental problems and Familial stressors AXIS V:  63  Level of Care:  OP  Hospital Course: Larry Hoover is an 42 y.o. male. He presents in the emergency department with IVC paperwork taken out by [facility]. He  only presents with a custody order; there is no petition or affidavit; so one was completed. Patient was seen by tele-psychiatrist who recommended inpatient services. Patient states he wants to harm himself because his life isn't worth living. He reports that all three of his children have been taken away from him; he sees his two younger children daily, as his cousin who lives near him, has taken custody for them. He states this gets to him as he has to watch someone else raise his kids. 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 He reports being in constant pain, even though his primary care and a specialist are prescribing him pain medication. He reports he doesn't want to be a burden on anyone and says he is going to shoot himself. He reports that he actually has a bullet he has "written" things on, which he will use to harm himself. He states he can only sleep when he is "medicated" because of the pain. Appetite is "so-so." States he has been to [facility] in 2003 for "bipolar disorder." Reports he has gotten mad in the past and then he is all right. He does have a seizure disorder that he reports taking Neurotin to treat his epilepsy; states he his last seizure was about 2 weeks ago. He is reporting using THC now, a few times a week, stating this helps him with the pain and with his "anxiety."   Upon admission in this hospital, and after admission assessment/evaluation, it was determined that Larry Hoover will need detoxification treatment protocol to stabilize his systems of drug intoxication and to combat the withdrawal symptoms of  these substances as well. Larry Hoover UDS report showed (+) Benzodiazepine, Opiate and Cannabis. However, his toxicology report indicated alcohol level of <11. He was then started on clonidine/Librium detoxification treatment protocols for his opiate/benzodiazepine  detoxification. He was also enrolled in group counseling sessions and activities to learn coping skills that should help him after discharge to cope better with his symptoms and manage his substance abuse/dependency issues for a much sustained sobriety. He also was enrolled/participated in the AA/NA meetings being offered and held on this unit. He has or rather presented with previously existing and or identifiable medical conditions that required treatment and or monitoring. He received medication management for all those health issues. He was monitored closely for any potential problems that may arise as a result of and or during detoxification treatment. Patient tolerated his detoxification treatment without any significant adverse effects and or reactions reported and or presented.  Patient attended treatment team meeting this am and met with the treatment team team members. His reason for admission, present symptoms, substance abuse issues, response to treatment and discharge plans discussed. Patient endorsed that he is doing well and stable for discharge to pursue the next phase of his substance abuse treatment. It was then agreed upon between patient and the team that he will be discharged to his home and follow-up care at [facility] clinic in [place], [place] on 05/14/12 between the hours of 07:30 am and 11:00 am. Patient is instructed that this is a walk-in appointment, and should make every effort to make this appointment timely.The address, time, date and contact information for this clinic provided for patient in writing.   Upon discharge, patient adamantly denies suicidal, homicidal ideations, auditory, visual hallucinations, delusional thoughts, paranoia and or withdrawal symptoms. Patient left [facility] with all personal belongings in no apparent distress. He received 2 weeks worth supply samples of his [facility] discharged medications, including a 30 days worth prescriptions for all his discharge medications.  Transportation per patient arrangement.  Consults:  None  Significant Diagnostic Studies:  labs: CBC with diff, CMP, UDS, Toxicology tests, U/A   Discharge Vitals:   Blood pressure 119/84, pulse 76, temperature 97.7 F (36.5 C), temperature source Oral, resp. rate 18, height 5' 8.5" (1.74 m), weight 84.369 kg (186 lb), SpO2 97.00%. Body mass index is 27.87 kg/(m^2). Lab Results:   No results found for this or any previous visit (from the past 72 hour(s)).  Physical Findings: AIMS: Facial and Oral Movements Muscles of Facial Expression: None, normal Lips and Perioral Area: None, normal Jaw: None, normal Tongue: None, normal,Extremity Movements Upper (arms, wrists, hands, fingers): None, normal Lower (legs, knees, ankles, toes): None, normal, Trunk Movements Neck, shoulders, hips: None, normal, Overall Severity Severity of abnormal movements (highest score from questions above): None, normal Incapacitation due to abnormal movements: None, normal Patient's awareness of abnormal movements (rate only patient's report): No Awareness, Dental Status Current problems with teeth and/or dentures?: Yes (missing upper and lower teeth) Does patient usually wear dentures?: No  CIWA:  CIWA-Ar Total: 0 COWS:  COWS Total Score: 0  Psychiatric Specialty Exam: See Psychiatric Specialty Exam and Suicide Risk Assessment completed by Attending Physician prior to discharge.  Discharge destination:  Home  Is patient on multiple antipsychotic therapies at discharge:  No   Has Patient had three or more failed trials of antipsychotic monotherapy by history:  No  Recommended Plan for Multiple Antipsychotic Therapies: NA     Medication List    STOP taking these medications  cyclobenzaprine 10 MG tablet  Commonly known as:  FLEXERIL      TAKE these medications     Indication   carbamazepine 200 MG 12 hr tablet  Commonly known as:  TEGRETOL XR  Take 1 tablet (200 mg total) by mouth 2  (two) times daily. For mood stabilization   Indication:  Manic-Depression, Complex Partial Epilepsy, Mood stabilization     DULoxetine 30 MG capsule  Commonly known as:  CYMBALTA  Take 1 capsule (30 mg total) by mouth at bedtime. For depression   Indication:  Major Depressive Disorder, Musculoskeletal Pain     lidocaine 5 %  Commonly known as:  LIDODERM  Place 1 patch onto the skin daily before breakfast. Remove & Discard patch within 12 hours or as directed by MD: For pain management   Indication:  Nerve Pain After Herpes Zoster or Shingles, Pain management     methocarbamol 500 MG tablet  Commonly known as:  ROBAXIN  Take 1 tablet (500 mg total) by mouth 3 (three) times daily. For muscle spasm   Indication:  Musculoskeletal Pain     methocarbamol 500 MG tablet  Commonly known as:  ROBAXIN  Take 1 tablet (500 mg total) by mouth 3 (three) times daily. For muscle spasms   Indication:  Musculoskeletal Pain, Muscle spasms     traZODone 100 MG tablet  Commonly known as:  DESYREL  Take 1 tablet (100 mg total) by mouth at bedtime. For Major depression/sleep   Indication:  Trouble Sleeping, Major Depressive Disorder       Follow-up Information   Follow up with [facility] On 05/14/2012. (Follow up at [facility], first visit on Friday 4/4 for initial assessment, go between the hours of 7:45 and 11AM. The earlier you arrive the earlier you will be seen)    Contact information:   [address] [place], [place] [address] [facility] [phone] FAX [phone]     Follow-up recommendations:  Activity:  As tolerated Diet: As recommended by your primary care doctor. Keep all scheduled follow-up appointments as recommended. Continue to work the relapse prevention plan Comments: Take all your medications as prescribed by your mental healthcare provider. Report any adverse effects and or reactions from your medicines to your outpatient provider promptly. Patient is instructed and cautioned to not  engage in alcohol and or illegal drug use while on prescription medicines. In the event of worsening symptoms, patient is instructed to call the crisis hotline, 911 and or go to the nearest ED for appropriate evaluation and treatment of symptoms. Follow-up with your primary care provider for your other medical issues, concerns and or health care needs.   Total Discharge Time:  Greater than 30 minutes.  Signed: Sanjuana Kava, PMHNP-NP. 05/12/2012, 2:02 PM

## 2012-05-12 NOTE — [facility] Suicide Risk Assessment (Signed)
Suicide Risk Assessment  Discharge Assessment     Demographic Factors:  Male and Caucasian  Mental Status Per Nursing Assessment::   On Admission:     Current Mental Status by Physician: In full contact with reality. There are no suicidal ideas, plans or intent. His mood is euthymic, his affect is appropriate. He states that he has not felt this good in a long time. States that he needs to take care of his physical health. Plans to go to  [facility] and see what he needs to do to be able to get the surgery. 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 Not being able to function physically as well as he did before has affected his self esteem. Staying busy was his way of dealing with stress. He states he had some good conversations with his 40 Y/O daughter and that she was very supportive, encouraging of him.   Loss Factors: Decline in physical health  Historical Factors: NA  Risk Reduction Factors:   Sense of responsibility to family  Continued Clinical Symptoms:  Depression:   Comorbid alcohol abuse/dependence Alcohol/Substance Abuse/Dependencies  Cognitive Features That Contribute To Risk:  Closed-mindedness Thought constriction (tunnel vision)    Suicide Risk:  Minimal: No identifiable suicidal ideation.  Patients presenting with no risk factors but with morbid ruminations; may be classified as minimal risk based on the severity of the depressive symptoms  Discharge Diagnoses:   AXIS I:  Opioid Dependence, Benzodiazepine Dependence, Depressive Disorder NOS, Unspecified episodic mood disorder AXIS II:  Deferred AXIS III:   Past Medical History  Diagnosis Date  . Cancer   . Seizures    AXIS IV:  problems with access to health care services AXIS V:  61-70 mild symptoms  Plan Of Care/Follow-up recommendations:  Activity:  as tolerated Diet:  regular Follow up outpatient basis Is patient on multiple antipsychotic therapies at discharge:  No   Has Patient had three or more  failed trials of antipsychotic monotherapy by history:  No  Recommended Plan for Multiple Antipsychotic Therapies: N/A   Aneliese Beaudry A 05/12/2012, 12:08 PM

## 2012-05-17 NOTE — Progress Notes (Signed)
Patient Discharge Instructions:  After Visit Summary (AVS):   Faxed to:  05/17/12 Discharge Summary Note:   Faxed to:  05/17/12 Psychiatric Admission Assessment Note:   Faxed to:  05/17/12 Suicide Risk Assessment - Discharge Assessment:   Faxed to:  05/17/12 Faxed/Sent to the Next Level Care provider:  05/17/12 Faxed to [facility] @ [phone]  Jerelene Redden, 05/17/2012, 4:01 PM

## 2012-11-10 ENCOUNTER — Encounter (INDEPENDENT_AMBULATORY_CARE_PROVIDER_SITE_OTHER): Payer: Self-pay | Admitting: *Deleted

## 2012-12-06 ENCOUNTER — Encounter (INDEPENDENT_AMBULATORY_CARE_PROVIDER_SITE_OTHER): Payer: Self-pay | Admitting: Internal Medicine

## 2012-12-06 ENCOUNTER — Ambulatory Visit (INDEPENDENT_AMBULATORY_CARE_PROVIDER_SITE_OTHER): Payer: BC Managed Care – PPO | Admitting: Internal Medicine

## 2012-12-06 VITALS — BP 110/76 | HR 60 | Temp 97.0°F | Ht 69.0 in | Wt 203.3 lb

## 2012-12-06 DIAGNOSIS — R110 Nausea: Secondary | ICD-10-CM

## 2012-12-06 DIAGNOSIS — G8929 Other chronic pain: Secondary | ICD-10-CM

## 2012-12-06 DIAGNOSIS — R1013 Epigastric pain: Secondary | ICD-10-CM

## 2012-12-06 NOTE — Patient Instructions (Addendum)
HIDA scan. May need an EGD in the near future. Take Nexium 30 minutes before breakfast.  OV in 1 month Fiber 4gm daily

## 2012-12-06 NOTE — Progress Notes (Signed)
Subjective:     Patient ID: Larry Hoover, male   DOB: May 05, 1970, 42 y.o.   MRN: [id number]  HPI Referred to our office by Dr. Olena Leatherwood for abdominal pain. He says he has abdominal pain. He says his stomach is sensitive. He says if he eats something, it will pass quickly and sometimes it doesn't. He cannot lie on his abdomen.   He has nausea after he eats. He also burps a lot. His appetite is good. He has gained 30 pounds this year. He cannot eat grease and butter. This will cause diarrhea. He has nausea with hamburger. He is nauseated every morning. He has some acid reflux. He usually has a BM about 3-4 stools a day. He says they are not solid. Stools are mushy. Seen in the ED in September for same complaints. No diagnosis.  10/18/2012 CT abdomen/pelvis with CM: No finding to explain the patient's abdominal pain. Bilateral nonobstructive renal calculi. No hydronephrosis.  10/18/2012 WBC 8.45, H and H 16.6 AND 48.7, mcv 96.6, Platelet ct 24 AST 18, ALT 24, ALP 69, albmin 4.7, Amylase 97, Lipase 30, Glucose 98, total bili 0.8.  Hx of chronic pain and nausea.  Review of Systems See hpi No current outpatient prescriptions on file.   No current facility-administered medications for this visit.   Past Medical History  Diagnosis Date  . Cancer     testicular cancer in 2007  . Seizures    Past Surgical History  Procedure Laterality Date  . Right testical removed  2007  . Appendectomy  2013 ruptured      Objective:   Physical Exam  Filed Vitals:   12/06/12 1102  BP: 110/76  Pulse: 60  Temp: 97 F (36.1 C)  Height: 5\' 9"  (1.753 m)  Weight: 203 lb 4.8 oz (92.216 kg)   Alert and oriented. Skin warm and dry. Oral mucosa is moist.   . Sclera anicteric, conjunctivae is pink. Thyroid not enlarged. No cervical lymphadenopathy. Lungs clear. Heart regular rate and rhythm.  Abdomen is soft. Bowel sounds are positive. No hepatomegaly. Diffuse abdominal soreness. . No tenderness.  No edema to  lower extremities.       Assessment:    Chronic nausea. Diffuse chronic abdominal pain since September. States he has soreness now. CT recently was negative for ana acute process.    GB disease needs to be ruled out. GERD: not controlled at this time. Has been taking OTC but stopped. Plan:   HIDA scan.  May need an EGD in the near future. Nexium 30 minutes before breakfast. Fiber 4 gm daily for loose stools.

## 2012-12-13 ENCOUNTER — Encounter (HOSPITAL_COMMUNITY): Payer: Self-pay

## 2012-12-13 ENCOUNTER — Encounter (HOSPITAL_COMMUNITY)
Admission: RE | Admit: 2012-12-13 | Discharge: 2012-12-13 | Disposition: A | Discharge status: Home / Self Care | Payer: BC Managed Care – PPO | Source: Ambulatory Visit | Attending: Internal Medicine | Admitting: Internal Medicine

## 2012-12-13 DIAGNOSIS — R1013 Epigastric pain: Secondary | ICD-10-CM

## 2012-12-13 MED ORDER — SINCALIDE 5 MCG IJ SOLR
INTRAMUSCULAR | Status: AC
  Administered 2012-12-13: 1.86 ug via INTRAVENOUS
  Filled 2012-12-13: qty 5

## 2012-12-13 MED ORDER — STERILE WATER FOR INJECTION IJ SOLN
INTRAMUSCULAR | Status: AC
  Administered 2012-12-13: 1.86 mL via INTRAVENOUS
  Filled 2012-12-13: qty 10

## 2012-12-13 MED ORDER — TECHNETIUM TC 99M MEBROFENIN IV KIT
5.0000 | PACK | Freq: Once | INTRAVENOUS | Status: AC | PRN
  Administered 2012-12-13: 5 via INTRAVENOUS

## 2012-12-15 ENCOUNTER — Other Ambulatory Visit (INDEPENDENT_AMBULATORY_CARE_PROVIDER_SITE_OTHER): Payer: Self-pay | Admitting: *Deleted

## 2012-12-15 DIAGNOSIS — R110 Nausea: Secondary | ICD-10-CM

## 2012-12-15 DIAGNOSIS — G8929 Other chronic pain: Secondary | ICD-10-CM

## 2012-12-15 NOTE — Progress Notes (Signed)
Left message w/ fam member to have patient call me to schedule EGD

## 2012-12-16 ENCOUNTER — Encounter (HOSPITAL_COMMUNITY)
Admission: RE | Disposition: A | Discharge status: Home / Self Care | Payer: Self-pay | Source: Ambulatory Visit | Attending: Internal Medicine

## 2012-12-16 ENCOUNTER — Encounter (HOSPITAL_COMMUNITY): Payer: Self-pay

## 2012-12-16 ENCOUNTER — Ambulatory Visit (HOSPITAL_COMMUNITY)
Admission: RE | Admit: 2012-12-16 | Discharge: 2012-12-16 | Disposition: A | Discharge status: Home / Self Care | Payer: BC Managed Care – PPO | Source: Ambulatory Visit | Attending: Internal Medicine | Admitting: Internal Medicine

## 2012-12-16 DIAGNOSIS — K2100 Gastro-esophageal reflux disease with esophagitis, without bleeding: Secondary | ICD-10-CM | POA: Insufficient documentation

## 2012-12-16 DIAGNOSIS — I10 Essential (primary) hypertension: Secondary | ICD-10-CM | POA: Insufficient documentation

## 2012-12-16 DIAGNOSIS — R109 Unspecified abdominal pain: Secondary | ICD-10-CM

## 2012-12-16 DIAGNOSIS — R110 Nausea: Secondary | ICD-10-CM

## 2012-12-16 DIAGNOSIS — K449 Diaphragmatic hernia without obstruction or gangrene: Secondary | ICD-10-CM

## 2012-12-16 DIAGNOSIS — R112 Nausea with vomiting, unspecified: Secondary | ICD-10-CM

## 2012-12-16 DIAGNOSIS — G8929 Other chronic pain: Secondary | ICD-10-CM

## 2012-12-16 DIAGNOSIS — K208 Other esophagitis: Secondary | ICD-10-CM

## 2012-12-16 HISTORY — DX: Essential (primary) hypertension: I10

## 2012-12-16 HISTORY — PX: ESOPHAGOGASTRODUODENOSCOPY: SHX5428

## 2012-12-16 SURGERY — EGD (ESOPHAGOGASTRODUODENOSCOPY)
Anesthesia: Moderate Sedation

## 2012-12-16 MED ORDER — PANTOPRAZOLE SODIUM 40 MG PO TBEC: 40.0000 mg | DELAYED_RELEASE_TABLET | Freq: Two times a day (BID) | ORAL | Status: DC

## 2012-12-16 MED ORDER — MEPERIDINE HCL 50 MG/ML IJ SOLN
INTRAMUSCULAR | Status: AC
  Filled 2012-12-16: qty 1

## 2012-12-16 MED ORDER — STERILE WATER FOR IRRIGATION IR SOLN
Status: DC | PRN
  Administered 2012-12-16: 15:00:00

## 2012-12-16 MED ORDER — MIDAZOLAM HCL 5 MG/5ML IJ SOLN
INTRAMUSCULAR | Status: AC
  Filled 2012-12-16: qty 10

## 2012-12-16 MED ORDER — SODIUM CHLORIDE 0.9 % IV SOLN
INTRAVENOUS | Status: DC
  Administered 2012-12-16: 15:00:00 via INTRAVENOUS

## 2012-12-16 MED ORDER — PROMETHAZINE HCL 25 MG/ML IJ SOLN
INTRAMUSCULAR | Status: DC | PRN
  Administered 2012-12-16: 25 mg via INTRAVENOUS

## 2012-12-16 MED ORDER — MEPERIDINE HCL 50 MG/ML IJ SOLN
INTRAMUSCULAR | Status: DC | PRN
  Administered 2012-12-16 (×2): 25 mg via INTRAVENOUS

## 2012-12-16 MED ORDER — PROMETHAZINE HCL 25 MG/ML IJ SOLN
INTRAMUSCULAR | Status: AC
  Filled 2012-12-16: qty 1

## 2012-12-16 MED ORDER — MIDAZOLAM HCL 5 MG/5ML IJ SOLN
INTRAMUSCULAR | Status: DC | PRN
  Administered 2012-12-16 (×2): 3 mg via INTRAVENOUS
  Administered 2012-12-16: 4 mg via INTRAVENOUS

## 2012-12-16 MED ORDER — BUTAMBEN-TETRACAINE-BENZOCAINE 2-2-14 % EX AERO
INHALATION_SPRAY | CUTANEOUS | Status: DC | PRN
  Administered 2012-12-16: 2 via TOPICAL

## 2012-12-16 MED ORDER — SODIUM CHLORIDE 0.9 % IJ SOLN
INTRAMUSCULAR | Status: AC
  Filled 2012-12-16: qty 10

## 2012-12-16 MED ORDER — DICYCLOMINE HCL 10 MG PO CAPS: 10.0000 mg | ORAL_CAPSULE | Freq: Three times a day (TID) | ORAL | Status: DC

## 2012-12-16 NOTE — Op Note (Signed)
EGD PROCEDURE REPORT  PATIENT:  Larry Hoover  MR#:  [id number] Birthdate:  [birth year], 42 y.o., male Endoscopist:  Dr. Malissa Hippo, MD Referred By:  Dr. Pervis Hocking, MD Procedure Date: 12/16/2012  Procedure:   EGD  Indications:  Patient is 42 year old Caucasian male who presents with chronic abdominal pain and nausea and vomiting. Recent abdominopelvic CT did not reveal any source of his symptoms and HIDA scan with CCK was unremarkable.            Informed Consent:  The risks, benefits, alternatives & imponderables which include, but are not limited to, bleeding, infection, perforation, drug reaction and potential missed lesion have been reviewed.  The potential for biopsy, lesion removal, esophageal dilation, etc. have also been discussed.  Questions have been answered.  All parties agreeable.  Please see history & physical in medical record for more information.  Medications:  Demerol 50 mg IV Versed 10 mg IV Promethazine 25 mg IV and diluted form Cetacaine spray topically for oropharyngeal anesthesia  Description of procedure:  The endoscope was introduced through the mouth and advanced to the second portion of the duodenum without difficulty or limitations. The mucosal surfaces were surveyed very carefully during advancement of the scope and upon withdrawal.  Findings:  Esophagus:  Mucosa of the proximal and middle third was normal linear erosions noted in the distal centimeter of the esophagus. One erosion was at least 10 mm long extending to GE junction. No ring or stricture noted. GEJ:  38 cm Hiatus:  40 cm Stomach:  Stomach was empty and distended very well with insufflation. Folds in the proximal stomach were normal. Examination of mucosa at body, antrum, pyloric channel, angularis fundus and cardia was normal. Duodenum:  Normal bulbar and post bulbar mucosa.  Therapeutic/Diagnostic Maneuvers Performed:  None  Complications:  None  Impression: Erosive reflux  esophagitis. Small sliding hiatal hernia.    Recommendations:  Anti-reflux measures reinforced. Discontinue Nexium. Pantoprazole 40 mg by mouth twice a day. Dicyclomine 10 mg by mouth 3 times a day. Office visit in 8 weeks.  Shereese Bonnie U  12/16/2012  3:51 PM  CC: Dr. Kirk Ruths, MD & Dr. Bonnetta Barry ref. provider found

## 2012-12-16 NOTE — H&P (Signed)
Larry Hoover is an 42 y.o. male.   Chief Complaint: Patient is here for EGD. HPI: Patient is 42 year old Caucasian male who presents with chronic generalized abdominal pain. He states pain started after he finished chemotherapy for testicular cancer in 2008. Pain is described as sharp and burning. He says he times he expels flattest continuously for 3-4 hours before he gets better. He also complains of nausea and vomiting on making up almost every day. He denies hematemesis melena or rectal bleeding. He has fair appetite. He has not lost any weight recently. He does not relieve of his pain when he takes Xanax and or smokes marijuana. He had abdominopelvic CT about 2 months ago and no cause for pain was found. He had bilateral nonobstructive renal calculi. He underwent HIDA scan with CCK earlier this week which was within normal limits.  Past Medical History  Diagnosis Date  . Cancer     testicular cancer in 2007  . Seizures   . Hypertension     Past Surgical History  Procedure Laterality Date  . Right testical removed  2007  . Appendectomy  2013 ruptured    History reviewed. No pertinent family history. Social History:  reports that he has never smoked. He does not have any smokeless tobacco history on file. He reports that he uses illicit drugs (Marijuana) about once per week. He reports that he does not drink alcohol.  Allergies:  Allergies  Allergen Reactions  . Penicillins Rash  . Morphine And Related     Medications Prior to Admission  Medication Sig Dispense Refill  . esomeprazole (NEXIUM) 20 MG capsule Take 20 mg by mouth daily at 12 noon.        No results found for this or any previous visit (from the past 48 hour(s)). No results found.  ROS  Blood pressure 154/111, pulse 71, temperature 97.6 F (36.4 C), temperature source Oral, resp. rate 18, height 5\' 10"  (1.778 m), weight 203 lb (92.08 kg), SpO2 97.00%. Physical Exam  Constitutional: He appears  well-nourished.  HENT:  Mouth/Throat: Oropharynx is clear and moist.  Eyes: Conjunctivae are normal. No scleral icterus.  Neck: No thyromegaly present.  Cardiovascular: Normal rate, regular rhythm and normal heart sounds.   No murmur heard. Respiratory: Effort normal and breath sounds normal.  GI: Soft. He exhibits no distension and no mass. There is tenderness (mild tenderness in. Dr. region and right lower quadrant).  Musculoskeletal: He exhibits no edema.  Lymphadenopathy:    He has no cervical adenopathy.  Neurological: He is alert. He displays abnormal reflex.  Skin: Skin is warm and dry.     Assessment/Plan Chronic abdominal pain, nausea and vomiting. Diagnostic EGD.  Clarene Curran U 12/16/2012, 3:13 PM

## 2012-12-22 ENCOUNTER — Encounter (HOSPITAL_COMMUNITY): Payer: Self-pay | Admitting: Internal Medicine

## 2013-01-10 ENCOUNTER — Ambulatory Visit (INDEPENDENT_AMBULATORY_CARE_PROVIDER_SITE_OTHER): Payer: BC Managed Care – PPO | Admitting: Internal Medicine

## 2013-01-10 ENCOUNTER — Encounter (INDEPENDENT_AMBULATORY_CARE_PROVIDER_SITE_OTHER): Payer: Self-pay | Admitting: Internal Medicine

## 2013-01-10 VITALS — BP 98/70 | HR 78 | Temp 97.8°F | Ht 69.0 in | Wt 203.2 lb

## 2013-01-10 DIAGNOSIS — K2210 Ulcer of esophagus without bleeding: Secondary | ICD-10-CM

## 2013-01-10 NOTE — Progress Notes (Signed)
Subjective:     Patient ID: Larry Hoover, male   DOB: 1970/09/03, 42 y.o.   MRN: [id number]  HPI Here today for f/u after recent EGD 12/2012.  Seen in October for epigastric pain and nausea. The pain was associated with eating.  He underwent a HIDA scan which was normal.  EGD revealed erosive reflux esophagitis.  Started on Protonix 40mg  BID.  He says the epigastric pain is better. He says he is 35% better. He says if he works he will have epigastric pain.  If he lifts anything he will have pain across his abdomen He says he has frequent burping.  He has been taking before eating and before he eats supper. Appetite is good . No weight loss.  He has a BM about daily or every other day.  He does have urgency sometimes. No melena or bright red rectal bleeding. He does have flatus.  EGD 12/2012 Dr. Karilyn Cota.  Impression:  Erosive reflux esophagitis.  Small sliding hiatal hernia.  Recommendations:  Anti-reflux measures reinforced.  Discontinue Nexium.  Pantoprazole 40 mg by mouth twice a day.  Dicyclomine 10 mg by mouth 3 times a day.  Office visit in 8 weeks.   Review of Systems see hpi Current Outpatient Prescriptions  Medication Sig Dispense Refill  . dicyclomine (BENTYL) 10 MG capsule Take 1 capsule (10 mg total) by mouth 4 (four) times daily -  before meals and at bedtime.  90 capsule  3  . pantoprazole (PROTONIX) 40 MG tablet Take 1 tablet (40 mg total) by mouth 2 (two) times daily before a meal.  60 tablet  3   No current facility-administered medications for this visit.   Past Medical History  Diagnosis Date  . Cancer     testicular cancer in 2007  . Seizures   . Hypertension    Past Surgical History  Procedure Laterality Date  . Right testical removed  2007  . Appendectomy  2013 ruptured  . Esophagogastroduodenoscopy N/A 12/16/2012    Procedure: ESOPHAGOGASTRODUODENOSCOPY (EGD);  Surgeon: Malissa Hippo, MD;  Location: [facility];  Service: Endoscopy;  Laterality:  N/A;  255        Objective:   Physical Exam There were no vitals filed for this visit. Filed Vitals:   01/10/13 1011  BP: 98/70  Pulse: 78  Temp: 97.8 F (36.6 C)  Height: 5\' 9"  (1.753 m)  Weight: 203 lb 3.2 oz (92.171 kg)   Alert and oriented. Skin warm and dry. Oral mucosa is moist.   . Sclera anicteric, conjunctivae is pink. Thyroid not enlarged. No cervical lymphadenopathy. Lungs clear. Heart regular rate and rhythm.  Abdomen is soft. Bowel sounds are positive. No hepatomegaly. No abdominal masses felt. No tenderness.  No edema to lower extremities.        Assessment:    Erosive esophagitis. Presently taking Protonix BID.  He feels better.     Plan:    OV in 6 months. Continue the Protonix and Dicyclomine

## 2013-01-10 NOTE — Patient Instructions (Signed)
OV in 6 months. Try not to overdue physical labor. Continue the Protonix and Dicyclomine

## 2013-02-01 ENCOUNTER — Encounter (INDEPENDENT_AMBULATORY_CARE_PROVIDER_SITE_OTHER): Payer: Self-pay

## 2013-07-11 ENCOUNTER — Ambulatory Visit (INDEPENDENT_AMBULATORY_CARE_PROVIDER_SITE_OTHER): Payer: Self-pay | Admitting: Internal Medicine

## 2013-07-15 ENCOUNTER — Encounter (INDEPENDENT_AMBULATORY_CARE_PROVIDER_SITE_OTHER): Payer: Self-pay | Admitting: *Deleted

## 2013-11-19 ENCOUNTER — Encounter (HOSPITAL_COMMUNITY): Payer: Self-pay | Admitting: Emergency Medicine

## 2013-11-19 ENCOUNTER — Emergency Department (HOSPITAL_COMMUNITY)
Admission: EM | Admit: 2013-11-19 | Discharge: 2013-11-19 | Disposition: A | Discharge status: Home / Self Care | Payer: BC Managed Care – PPO | Attending: Emergency Medicine | Admitting: Emergency Medicine

## 2013-11-19 ENCOUNTER — Emergency Department (HOSPITAL_COMMUNITY): Payer: BC Managed Care – PPO

## 2013-11-19 DIAGNOSIS — Z9089 Acquired absence of other organs: Secondary | ICD-10-CM | POA: Insufficient documentation

## 2013-11-19 DIAGNOSIS — Z9079 Acquired absence of other genital organ(s): Secondary | ICD-10-CM | POA: Insufficient documentation

## 2013-11-19 DIAGNOSIS — Z8547 Personal history of malignant neoplasm of testis: Secondary | ICD-10-CM

## 2013-11-19 DIAGNOSIS — Z8659 Personal history of other mental and behavioral disorders: Secondary | ICD-10-CM | POA: Insufficient documentation

## 2013-11-19 DIAGNOSIS — I10 Essential (primary) hypertension: Secondary | ICD-10-CM | POA: Insufficient documentation

## 2013-11-19 DIAGNOSIS — R1032 Left lower quadrant pain: Secondary | ICD-10-CM

## 2013-11-19 DIAGNOSIS — K219 Gastro-esophageal reflux disease without esophagitis: Secondary | ICD-10-CM | POA: Insufficient documentation

## 2013-11-19 DIAGNOSIS — Z79899 Other long term (current) drug therapy: Secondary | ICD-10-CM | POA: Insufficient documentation

## 2013-11-19 DIAGNOSIS — N508 Other specified disorders of male genital organs: Secondary | ICD-10-CM | POA: Insufficient documentation

## 2013-11-19 DIAGNOSIS — N50812 Left testicular pain: Secondary | ICD-10-CM

## 2013-11-19 DIAGNOSIS — Z880 Allergy status to penicillin: Secondary | ICD-10-CM | POA: Insufficient documentation

## 2013-11-19 DIAGNOSIS — Z9889 Other specified postprocedural states: Secondary | ICD-10-CM | POA: Insufficient documentation

## 2013-11-19 LAB — CBC WITH DIFFERENTIAL/PLATELET
Basophils Absolute: 0 10*3/uL (ref 0.0–0.1)
Basophils Relative: 0 % (ref 0–1)
EOS ABS: 0.4 10*3/uL (ref 0.0–0.7)
EOS PCT: 5 % (ref 0–5)
HCT: 46.7 % (ref 39.0–52.0)
HEMOGLOBIN: 16.2 g/dL (ref 13.0–17.0)
LYMPHS ABS: 2.1 10*3/uL (ref 0.7–4.0)
Lymphocytes Relative: 26 % (ref 12–46)
MCH: 33.1 pg (ref 26.0–34.0)
MCHC: 34.7 g/dL (ref 30.0–36.0)
MCV: 95.3 fL (ref 78.0–100.0)
MONOS PCT: 7 % (ref 3–12)
Monocytes Absolute: 0.6 10*3/uL (ref 0.1–1.0)
Neutro Abs: 5 10*3/uL (ref 1.7–7.7)
Neutrophils Relative %: 61 % (ref 43–77)
PLATELETS: 187 10*3/uL (ref 150–400)
RBC: 4.9 MIL/uL (ref 4.22–5.81)
RDW: 12.3 % (ref 11.5–15.5)
WBC: 8.2 10*3/uL (ref 4.0–10.5)

## 2013-11-19 LAB — URINALYSIS, ROUTINE W REFLEX MICROSCOPIC
BILIRUBIN URINE: NEGATIVE
Glucose, UA: NEGATIVE mg/dL
Hgb urine dipstick: NEGATIVE
Ketones, ur: NEGATIVE mg/dL
Leukocytes, UA: NEGATIVE
NITRITE: NEGATIVE
Protein, ur: NEGATIVE mg/dL
Specific Gravity, Urine: 1.015 (ref 1.005–1.030)
UROBILINOGEN UA: 0.2 mg/dL (ref 0.0–1.0)
pH: 7.5 (ref 5.0–8.0)

## 2013-11-19 LAB — COMPREHENSIVE METABOLIC PANEL
ALT: 34 U/L (ref 0–53)
ANION GAP: 11 (ref 5–15)
AST: 24 U/L (ref 0–37)
Albumin: 4.2 g/dL (ref 3.5–5.2)
Alkaline Phosphatase: 73 U/L (ref 39–117)
BUN: 15 mg/dL (ref 6–23)
CALCIUM: 9.3 mg/dL (ref 8.4–10.5)
CO2: 27 mEq/L (ref 19–32)
CREATININE: 1.23 mg/dL (ref 0.50–1.35)
Chloride: 103 mEq/L (ref 96–112)
GFR calc non Af Amer: 71 mL/min — ABNORMAL LOW (ref >90)
GFR, EST AFRICAN AMERICAN: 83 mL/min — AB (ref >90)
GLUCOSE: 87 mg/dL (ref 70–99)
Potassium: 4.4 mEq/L (ref 3.7–5.3)
Sodium: 141 mEq/L (ref 137–147)
Total Bilirubin: 0.2 mg/dL — ABNORMAL LOW (ref 0.3–1.2)
Total Protein: 7.5 g/dL (ref 6.0–8.3)

## 2013-11-19 MED ORDER — CIPROFLOXACIN HCL 500 MG PO TABS: 500.0000 mg | ORAL_TABLET | Freq: Once | ORAL | Status: DC

## 2013-11-19 MED ORDER — IOHEXOL 300 MG/ML  SOLN
100.0000 mL | Freq: Once | INTRAMUSCULAR | Status: AC | PRN
  Administered 2013-11-19: 100 mL via INTRAVENOUS

## 2013-11-19 MED ORDER — IOHEXOL 300 MG/ML  SOLN
50.0000 mL | Freq: Once | INTRAMUSCULAR | Status: AC | PRN
  Administered 2013-11-19: 50 mL via ORAL

## 2013-11-19 MED ORDER — HYDROCODONE-ACETAMINOPHEN 5-325 MG PO TABS: 2.0000 | ORAL_TABLET | ORAL | Status: DC | PRN

## 2013-11-19 MED ORDER — CIPROFLOXACIN HCL 500 MG PO TABS: 500.0000 mg | ORAL_TABLET | Freq: Two times a day (BID) | ORAL | Status: DC

## 2013-11-19 MED ORDER — HYDROCODONE-ACETAMINOPHEN 5-325 MG PO TABS: 1.0000 | ORAL_TABLET | Freq: Four times a day (QID) | ORAL | Status: DC | PRN

## 2013-11-19 NOTE — Discharge Instructions (Signed)
Cipro as prescribed.  Hydrocodone as prescribed as needed for pain.  Return to radiology on Monday for an ultrasound of your testicle. Follow these results up with urology. Call to arrange a followup appointment. The contact information for [facility] has been provided in this discharge summary.   Abdominal Pain Many things can cause abdominal pain. Usually, abdominal pain is not caused by a disease and will improve without treatment. It can often be observed and treated at home. Your health care provider will do a physical exam and possibly order blood tests and X-rays to help determine the seriousness of your pain. However, in many cases, more time must pass before a clear cause of the pain can be found. Before that point, your health care provider may not know if you need more testing or further treatment. HOME CARE INSTRUCTIONS  Monitor your abdominal pain for any changes. The following actions may help to alleviate any discomfort you are experiencing:  Only take over-the-counter or prescription medicines as directed by your health care provider.  Do not take laxatives unless directed to do so by your health care provider.  Try a clear liquid diet (broth, tea, or water) as directed by your health care provider. Slowly move to a bland diet as tolerated. SEEK MEDICAL CARE IF:  You have unexplained abdominal pain.  You have abdominal pain associated with nausea or diarrhea.  You have pain when you urinate or have a bowel movement.  You experience abdominal pain that wakes you in the night.  You have abdominal pain that is worsened or improved by eating food.  You have abdominal pain that is worsened with eating fatty foods.  You have a fever. SEEK IMMEDIATE MEDICAL CARE IF:   Your pain does not go away within 2 hours.  You keep throwing up (vomiting).  Your pain is felt only in portions of the abdomen, such as the right side or the left lower portion of the  abdomen.  You pass bloody or black tarry stools. MAKE SURE YOU:  Understand these instructions.   Will watch your condition.   Will get help right away if you are not doing well or get worse.  Document Released: 11/06/2004 Document Revised: 02/01/2013 Document Reviewed: 10/06/2012 [facility] Patient Information 2015 [place], [place]. This information is not intended to replace advice given to you by your health care provider. Make sure you discuss any questions you have with your health care provider.

## 2013-11-19 NOTE — ED Notes (Signed)
XRAY called and reported that pt is to report tomorrow morning at 9am for out pt ultrasound. Pt aware. Awaiting d/c instructions.

## 2013-11-19 NOTE — ED Notes (Signed)
C/o LLQ pain radiating into left testicle x 8 days.

## 2013-11-19 NOTE — ED Provider Notes (Signed)
CSN: [id number]     Arrival date & time 11/19/13  1555 History   First MD Initiated Contact with Patient 11/19/13 1557     Chief Complaint  Patient presents with  . Abdominal Pain     (Consider location/radiation/quality/duration/timing/severity/associated sxs/prior Treatment) HPI Comments: Patient is a 43 year old male with history of right sided testicular cancer. He was treated with removal of the testicle and chemotherapy in 2007. He presents today with complaints of pain in his left testicle and left lower quadrant and has been worsening over the past week. He denies any injury or trauma. He denies any dysuria or fever. He does report having what he reports as "dirty semen".  Patient is a 43 y.o. male presenting with abdominal pain. The history is provided by the patient.  Abdominal Pain Pain location:  LLQ Pain quality: stabbing   Pain radiates to:  LLQ Pain severity:  Moderate Onset quality:  Gradual Duration:  1 week Timing:  Constant Progression:  Worsening Chronicity:  New Relieved by:  Nothing Worsened by:  Nothing tried Ineffective treatments:  None tried   Past Medical History  Diagnosis Date  . Cancer     testicular cancer in 2007  . Seizures   . Hypertension   . GERD (gastroesophageal reflux disease)    Past Surgical History  Procedure Laterality Date  . Right testical removed  2007  . Appendectomy  2013 ruptured  . Esophagogastroduodenoscopy N/A 12/16/2012    Procedure: ESOPHAGOGASTRODUODENOSCOPY (EGD);  Surgeon: Rogene Houston, MD;  [facility]: [facility];  Service: Endoscopy;  Laterality: N/A;  255   No family history on file. History  Substance Use Topics  . Smoking status: Never Smoker   . Smokeless tobacco: Not on file  . Alcohol Use: No    Review of Systems  Gastrointestinal: Positive for abdominal pain.  All other systems reviewed and are negative.     Allergies  Penicillins and Morphine and related  Home Medications   Prior to  Admission medications   Medication Sig Start Date End Date Taking? Authorizing Provider  dicyclomine (BENTYL) 10 MG capsule Take 1 capsule (10 mg total) by mouth 4 (four) times daily -  before meals and at bedtime. 12/16/12   Rogene Houston, MD  pantoprazole (PROTONIX) 40 MG tablet Take 1 tablet (40 mg total) by mouth 2 (two) times daily before a meal. 12/16/12   Rogene Houston, MD   BP 128/89  Pulse 78  Temp(Src) 97.6 F (36.4 C) (Oral)  Resp 18  SpO2 100% Physical Exam  Nursing note and vitals reviewed. Constitutional: He is oriented to person, place, and time. He appears well-developed and well-nourished. No distress.  HENT:  Head: Normocephalic and atraumatic.  Neck: Normal range of motion. Neck supple.  Cardiovascular: Normal rate, regular rhythm and normal heart sounds.   No murmur heard. Pulmonary/Chest: Effort normal and breath sounds normal. No respiratory distress.  Abdominal: Soft. Bowel sounds are normal. He exhibits no distension. There is tenderness.  There is tenderness to palpation in the left lower quadrant. There is no rebound and no guarding.  Genitourinary: Penis normal.  The right testicle is surgically absent. Left testicle appears normal in size and is freely mobile. There is some tenderness to palpation of the testicle and left groin. No definite masses are palpable.  Musculoskeletal: Normal range of motion.  Neurological: He is alert and oriented to person, place, and time.  Skin: Skin is warm and dry. He is not diaphoretic.  ED Course  Procedures (including critical care time) Labs Review Labs Reviewed  CBC WITH DIFFERENTIAL  COMPREHENSIVE METABOLIC PANEL  URINALYSIS, ROUTINE W REFLEX MICROSCOPIC    Imaging Review No results found.   EKG Interpretation None      MDM   Final diagnoses:  None    Patient is a 43 year old male with history of right sided testicular cancer treated in 2007 with surgery and chemotherapy. He presents today with  complaints of left lower quadrant pain radiating into his testicle for the past week. His laboratory studies and urinalysis are unremarkable. CT of the abdomen and pelvis reveals no acute intra-abdominal pathology.  He will require an ultrasound which will be arranged for Monday to investigate and any other pathology in his testicle. I will treat for presumed epididymitis with Cipro and pain medication in the meantime.    Veryl Speak, MD 11/19/13 [phone]

## 2013-11-20 ENCOUNTER — Ambulatory Visit (HOSPITAL_COMMUNITY)
Admit: 2013-11-20 | Discharge: 2013-11-20 | Disposition: A | Discharge status: Home / Self Care | Payer: BC Managed Care – PPO | Source: Ambulatory Visit | Attending: Emergency Medicine | Admitting: Emergency Medicine

## 2013-11-20 ENCOUNTER — Other Ambulatory Visit (HOSPITAL_COMMUNITY): Payer: Self-pay | Admitting: Emergency Medicine

## 2013-11-20 DIAGNOSIS — N50819 Testicular pain, unspecified: Secondary | ICD-10-CM

## 2013-11-20 DIAGNOSIS — N508 Other specified disorders of male genital organs: Secondary | ICD-10-CM | POA: Insufficient documentation

## 2014-04-20 ENCOUNTER — Emergency Department (HOSPITAL_COMMUNITY): Payer: Self-pay

## 2014-04-20 ENCOUNTER — Emergency Department (HOSPITAL_COMMUNITY)
Admission: EM | Admit: 2014-04-20 | Discharge: 2014-04-21 | Disposition: A | Discharge status: Home / Self Care | Payer: Self-pay | Attending: Emergency Medicine | Admitting: Emergency Medicine

## 2014-04-20 ENCOUNTER — Encounter (HOSPITAL_COMMUNITY): Payer: Self-pay | Admitting: Emergency Medicine

## 2014-04-20 DIAGNOSIS — Z8719 Personal history of other diseases of the digestive system: Secondary | ICD-10-CM | POA: Insufficient documentation

## 2014-04-20 DIAGNOSIS — Z880 Allergy status to penicillin: Secondary | ICD-10-CM | POA: Insufficient documentation

## 2014-04-20 DIAGNOSIS — I10 Essential (primary) hypertension: Secondary | ICD-10-CM | POA: Insufficient documentation

## 2014-04-20 DIAGNOSIS — N200 Calculus of kidney: Secondary | ICD-10-CM | POA: Insufficient documentation

## 2014-04-20 DIAGNOSIS — Z8547 Personal history of malignant neoplasm of testis: Secondary | ICD-10-CM | POA: Insufficient documentation

## 2014-04-20 LAB — COMPREHENSIVE METABOLIC PANEL
ALT: 31 U/L (ref 0–53)
AST: 25 U/L (ref 0–37)
Albumin: 4.7 g/dL (ref 3.5–5.2)
Alkaline Phosphatase: 72 U/L (ref 39–117)
Anion gap: 10 (ref 5–15)
BUN: 18 mg/dL (ref 6–23)
CO2: 21 mmol/L (ref 19–32)
Calcium: 9.3 mg/dL (ref 8.4–10.5)
Chloride: 108 mmol/L (ref 96–112)
Creatinine, Ser: 1.3 mg/dL (ref 0.50–1.35)
GFR calc Af Amer: 77 mL/min — ABNORMAL LOW (ref >90)
GFR calc non Af Amer: 67 mL/min — ABNORMAL LOW (ref >90)
Glucose, Bld: 136 mg/dL — ABNORMAL HIGH (ref 70–99)
Potassium: 4 mmol/L (ref 3.5–5.1)
Sodium: 139 mmol/L (ref 135–145)
Total Bilirubin: 1.1 mg/dL (ref 0.3–1.2)
Total Protein: 7.5 g/dL (ref 6.0–8.3)

## 2014-04-20 LAB — CBC WITH DIFFERENTIAL/PLATELET
Basophils Absolute: 0 10*3/uL (ref 0.0–0.1)
Basophils Relative: 0 % (ref 0–1)
Eosinophils Absolute: 0 10*3/uL (ref 0.0–0.7)
Eosinophils Relative: 0 % (ref 0–5)
HCT: 47.3 % (ref 39.0–52.0)
Hemoglobin: 17.3 g/dL — ABNORMAL HIGH (ref 13.0–17.0)
Lymphocytes Relative: 11 % — ABNORMAL LOW (ref 12–46)
Lymphs Abs: 1.4 10*3/uL (ref 0.7–4.0)
MCH: 34.1 pg — ABNORMAL HIGH (ref 26.0–34.0)
MCHC: 36.6 g/dL — ABNORMAL HIGH (ref 30.0–36.0)
MCV: 93.1 fL (ref 78.0–100.0)
Monocytes Absolute: 0.6 10*3/uL (ref 0.1–1.0)
Monocytes Relative: 5 % (ref 3–12)
Neutro Abs: 10.9 10*3/uL — ABNORMAL HIGH (ref 1.7–7.7)
Neutrophils Relative %: 84 % — ABNORMAL HIGH (ref 43–77)
Platelets: 201 10*3/uL (ref 150–400)
RBC: 5.08 MIL/uL (ref 4.22–5.81)
RDW: 12.2 % (ref 11.5–15.5)
WBC: 12.9 10*3/uL — ABNORMAL HIGH (ref 4.0–10.5)

## 2014-04-20 LAB — LIPASE, BLOOD: Lipase: 28 U/L (ref 11–59)

## 2014-04-20 MED ORDER — ONDANSETRON HCL 4 MG/2ML IJ SOLN
INTRAMUSCULAR | Status: AC
  Filled 2014-04-20: qty 2

## 2014-04-20 MED ORDER — ONDANSETRON HCL 4 MG/2ML IJ SOLN
4.0000 mg | Freq: Once | INTRAMUSCULAR | Status: AC
  Administered 2014-04-20: 4 mg via INTRAMUSCULAR

## 2014-04-20 MED ORDER — PROMETHAZINE HCL 25 MG/ML IJ SOLN
12.5000 mg | Freq: Once | INTRAMUSCULAR | Status: AC
  Administered 2014-04-20: 12.5 mg via INTRAVENOUS
  Filled 2014-04-20: qty 1

## 2014-04-20 MED ORDER — SODIUM CHLORIDE 0.9 % IV BOLUS (SEPSIS)
1000.0000 mL | Freq: Once | INTRAVENOUS | Status: AC
  Administered 2014-04-20: 1000 mL via INTRAVENOUS

## 2014-04-20 MED ORDER — HYDROMORPHONE HCL 1 MG/ML IJ SOLN
INTRAMUSCULAR | Status: AC
  Filled 2014-04-20: qty 1

## 2014-04-20 MED ORDER — HYDROMORPHONE HCL 1 MG/ML IJ SOLN
1.0000 mg | Freq: Once | INTRAMUSCULAR | Status: AC
Start: 2014-04-20 — End: 2014-04-20
  Administered 2014-04-20: 1 mg via INTRAVENOUS

## 2014-04-20 MED ORDER — HYDROMORPHONE HCL 1 MG/ML IJ SOLN
1.0000 mg | Freq: Once | INTRAMUSCULAR | Status: AC
  Administered 2014-04-20: 1 mg via INTRAVENOUS
  Filled 2014-04-20: qty 1

## 2014-04-20 NOTE — ED Notes (Signed)
Patient reports abdominal pain to left lower quadrant radiating to left inguinal area. States vomiting today greyish color emesis with foul odor. Also reports has had abdominal pain earlier this week.

## 2014-04-20 NOTE — ED Provider Notes (Signed)
CSN: [id number]     Arrival date & time 04/20/14  2042 History  This chart was scribed for Larry Manifold, MD by Delphia Grates, ED Scribe. This patient was seen in room [facility] and the patient's care was started at 10:26 PM.   Chief Complaint  Patient presents with  . Abdominal Pain    The history is provided by the patient. No language interpreter was used.     HPI Comments: Larry Hoover is a 44 y.o. male, with history of seizures, HTN, GERD, and testicular cancer, who presents to the Emergency Department complaining of severe LLQ abdominal pain that for the past 3 days, that has gotten worse today. There is associated nausea and vomiting. Patient states he is not able to tolerate PO. He has tried taking Vicodin, but was unable to keep this down. He reports history of appendectomy. He denies fever, chills, diarrhea, or urinary symptoms. Patient denies EtOH consumption.  Past Medical History  Diagnosis Date  . Cancer     testicular cancer in 2007  . Seizures   . Hypertension   . GERD (gastroesophageal reflux disease)    Past Surgical History  Procedure Laterality Date  . Right testical removed  2007  . Appendectomy  2013 ruptured  . Esophagogastroduodenoscopy N/A 12/16/2012    Procedure: ESOPHAGOGASTRODUODENOSCOPY (EGD);  Surgeon: Rogene Houston, MD;  Location: [facility];  Service: Endoscopy;  Laterality: N/A;  255   History reviewed. No pertinent family history. History  Substance Use Topics  . Smoking status: Never Smoker   . Smokeless tobacco: Not on file  . Alcohol Use: No    Review of Systems  Gastrointestinal: Positive for nausea, vomiting and abdominal pain.  All other systems reviewed and are negative.     Allergies  Penicillins and Morphine and related  Home Medications   Prior to Admission medications   Medication Sig Start Date End Date Taking? Authorizing Provider  HYDROcodone-acetaminophen (NORCO) 5-325 MG per tablet Take 1-2 tablets by  mouth every 6 (six) hours as needed. Patient taking differently: Take 1 tablet by mouth once as needed for moderate pain.  11/19/13  Yes Veryl Speak, MD  ciprofloxacin (CIPRO) 500 MG tablet Take 1 tablet (500 mg total) by mouth once. Patient not taking: Reported on 04/20/2014 11/19/13   Veryl Speak, MD   Triage Vitals: BP 158/83 mmHg  Pulse 86  Temp(Src) 98 F (36.7 C) (Oral)  Resp 22  Ht 5\' 10"  (1.778 m)  Wt 206 lb (93.441 kg)  BMI 29.56 kg/m2  SpO2 97%  Physical Exam  Constitutional: He appears well-developed and well-nourished. No distress.  Appears uncomfortable.  HENT:  Head: Normocephalic and atraumatic.  Right Ear: External ear normal.  Left Ear: External ear normal.  Eyes: Conjunctivae are normal. Right eye exhibits no discharge. Left eye exhibits no discharge. No scleral icterus.  Neck: Neck supple. No tracheal deviation present.  Cardiovascular: Normal rate, regular rhythm and intact distal pulses.   Pulmonary/Chest: Effort normal and breath sounds normal. No stridor. No respiratory distress. He has no wheezes. He has no rales.  Abdominal: Soft. Bowel sounds are normal. He exhibits no distension. There is tenderness. There is guarding. There is no rebound.  Diffusely tender with voluntary guarding. Worse in left abdomen.  Musculoskeletal: He exhibits no edema or tenderness.  Neurological: He is alert. He has normal strength. No cranial nerve deficit (no facial droop, extraocular movements intact, no slurred speech) or sensory deficit. He exhibits normal muscle tone. He  displays no seizure activity. Coordination normal.  Skin: Skin is warm and dry. No rash noted.  Psychiatric: He has a normal mood and affect.  Nursing note and vitals reviewed.   ED Course  Procedures (including critical care time)  DIAGNOSTIC STUDIES: Oxygen Saturation is 97% on room air, adequate by my interpretation.    COORDINATION OF CARE: At 2233 Discussed treatment plan with patient. Patient  agrees.   Labs Review Labs Reviewed  CBC WITH DIFFERENTIAL/PLATELET - Abnormal; Notable for the following:    WBC 12.9 (*)    Hemoglobin 17.3 (*)    MCH 34.1 (*)    MCHC 36.6 (*)    Neutrophils Relative % 84 (*)    Neutro Abs 10.9 (*)    Lymphocytes Relative 11 (*)    All other components within normal limits  COMPREHENSIVE METABOLIC PANEL - Abnormal; Notable for the following:    Glucose, Bld 136 (*)    GFR calc non Af Amer 67 (*)    GFR calc Af Amer 77 (*)    All other components within normal limits  LIPASE, BLOOD  URINALYSIS, ROUTINE W REFLEX MICROSCOPIC    Imaging Review No results found.   EKG Interpretation None      MDM   Final diagnoses:  Abdominal pain, unspecified abdominal location  Ureteral colic    59WI with L sided abdominal pain. Diffusely tender/guarding. Afebrile HD stable. On further review of records, pt has a hx of chronic abdominal pain and nausea and vomiting although it doesn't appear that he has been evaluated in ED recently for this. Previous abdominopelvic CTs did not reveal any source of his symptoms nor did HIDA scan with [facility]. Previous EGD in 2014 with erosive esophagitis, otherwise pretty unremarkable. Labs thus far pretty unremarkable aside from mild leukocytosis. Given degree of pain he seems to be in and tenderness on exam, will CT. Only one previous CT noted in over a year which was about 5 months ago.  Per my read of CT, proximal L ureteral stone. Many bacteria on UA. PCN allergy. Cipro ordered. Urine culture. Afebrile. Assuming to further pertinent findings on CT, I feel he can be discharged with urology FU.   I personally preformed the services scribed in my presence. The recorded information has been reviewed is accurate. Larry Manifold, MD.    Larry Manifold, MD 04/21/14 [phone]

## 2014-04-20 NOTE — ED Notes (Signed)
abd pain for several days, vomiting,

## 2014-04-21 LAB — URINALYSIS, ROUTINE W REFLEX MICROSCOPIC
Bilirubin Urine: NEGATIVE
Glucose, UA: NEGATIVE mg/dL
Ketones, ur: 40 mg/dL — AB
Leukocytes, UA: NEGATIVE
Nitrite: NEGATIVE
Specific Gravity, Urine: 1.02 (ref 1.005–1.030)
Urobilinogen, UA: 0.2 mg/dL (ref 0.0–1.0)
pH: 7 (ref 5.0–8.0)

## 2014-04-21 LAB — URINE MICROSCOPIC-ADD ON

## 2014-04-21 MED ORDER — OXYCODONE-ACETAMINOPHEN 5-325 MG PO TABS: 1.0000 | ORAL_TABLET | ORAL | Status: DC | PRN

## 2014-04-21 MED ORDER — OXYCODONE-ACETAMINOPHEN 5-325 MG PO TABS: 2.0000 | ORAL_TABLET | ORAL | Status: DC | PRN

## 2014-04-21 MED ORDER — CIPROFLOXACIN IN D5W 400 MG/200ML IV SOLN
400.0000 mg | Freq: Once | INTRAVENOUS | Status: AC
  Administered 2014-04-21: 400 mg via INTRAVENOUS
  Filled 2014-04-21: qty 200

## 2014-04-21 MED ORDER — IOHEXOL 300 MG/ML  SOLN
50.0000 mL | Freq: Once | INTRAMUSCULAR | Status: AC | PRN
  Administered 2014-04-21: 50 mL via ORAL

## 2014-04-21 MED ORDER — KETOROLAC TROMETHAMINE 30 MG/ML IJ SOLN
15.0000 mg | Freq: Once | INTRAMUSCULAR | Status: AC
  Administered 2014-04-21: 15 mg via INTRAVENOUS
  Filled 2014-04-21: qty 1

## 2014-04-21 MED ORDER — IOHEXOL 300 MG/ML  SOLN
100.0000 mL | Freq: Once | INTRAMUSCULAR | Status: AC | PRN
  Administered 2014-04-21: 50 mL via INTRAVENOUS

## 2014-04-21 MED ORDER — CIPROFLOXACIN HCL 500 MG PO TABS: 500.0000 mg | ORAL_TABLET | Freq: Two times a day (BID) | ORAL | Status: DC

## 2014-04-21 NOTE — Discharge Instructions (Signed)
Percocet as prescribed as needed for pain.  Follow-up with urology. Call to arrange this appointment. The contact information for [facility] in [place] has been provided in this discharge instruction.  Return to the emergency department if you develop worsening pain, high fever, or other new and concerning symptoms.   Kidney Stones Kidney stones (urolithiasis) are deposits that form inside your kidneys. The intense pain is caused by the stone moving through the urinary tract. When the stone moves, the ureter goes into spasm around the stone. The stone is usually passed in the urine.  CAUSES   A disorder that makes certain neck glands produce too much parathyroid hormone (primary hyperparathyroidism).  A buildup of uric acid crystals, similar to gout in your joints.  Narrowing (stricture) of the ureter.  A kidney obstruction present at birth (congenital obstruction).  Previous surgery on the kidney or ureters.  Numerous kidney infections. SYMPTOMS   Feeling sick to your stomach (nauseous).  Throwing up (vomiting).  Blood in the urine (hematuria).  Pain that usually spreads (radiates) to the groin.  Frequency or urgency of urination. DIAGNOSIS   Taking a history and physical exam.  Blood or urine tests.  CT scan.  Occasionally, an examination of the inside of the urinary bladder (cystoscopy) is performed. TREATMENT   Observation.  Increasing your fluid intake.  Extracorporeal shock wave lithotripsy--This is a noninvasive procedure that uses shock waves to break up kidney stones.  Surgery may be needed if you have severe pain or persistent obstruction. There are various surgical procedures. Most of the procedures are performed with the use of small instruments. Only small incisions are needed to accommodate these instruments, so recovery time is minimized. The size, location, and chemical composition are all important variables that will determine the proper  choice of action for you. Talk to your health care provider to better understand your situation so that you will minimize the risk of injury to yourself and your kidney.  HOME CARE INSTRUCTIONS   Drink enough water and fluids to keep your urine clear or pale yellow. This will help you to pass the stone or stone fragments.  Strain all urine through the provided strainer. Keep all particulate matter and stones for your health care provider to see. The stone causing the pain may be as small as a grain of salt. It is very important to use the strainer each and every time you pass your urine. The collection of your stone will allow your health care provider to analyze it and verify that a stone has actually passed. The stone analysis will often identify what you can do to reduce the incidence of recurrences.  Only take over-the-counter or prescription medicines for pain, discomfort, or fever as directed by your health care provider.  Make a follow-up appointment with your health care provider as directed.  Get follow-up X-rays if required. The absence of pain does not always mean that the stone has passed. It may have only stopped moving. If the urine remains completely obstructed, it can cause loss of kidney function or even complete destruction of the kidney. It is your responsibility to make sure X-rays and follow-ups are completed. Ultrasounds of the kidney can show blockages and the status of the kidney. Ultrasounds are not associated with any radiation and can be performed easily in a matter of minutes. SEEK MEDICAL CARE IF:  You experience pain that is progressive and unresponsive to any pain medicine you have been prescribed. SEEK IMMEDIATE MEDICAL CARE IF:  Pain cannot be controlled with the prescribed medicine.  You have a fever or shaking chills.  The severity or intensity of pain increases over 18 hours and is not relieved by pain medicine.  You develop a new onset of abdominal  pain.  You feel faint or pass out.  You are unable to urinate. MAKE SURE YOU:   Understand these instructions.  Will watch your condition.  Will get help right away if you are not doing well or get worse. Document Released: 01/27/2005 Document Revised: 09/29/2012 Document Reviewed: 06/30/2012 [facility] Patient Information 2015 [place], [place]. This information is not intended to replace advice given to you by your health care provider. Make sure you discuss any questions you have with your health care provider.

## 2014-04-21 NOTE — ED Provider Notes (Signed)
Care assumed from Dr. Wilson Singer at shift change. Patient awaiting results of CT scan. This reveals a moderate-sized stone in the proximal ureter. He is feeling better with medications in the ER. He will be discharged to home with instructions to follow-up with urology.  Veryl Speak, MD 04/21/14 [phone]

## 2014-04-25 ENCOUNTER — Emergency Department (HOSPITAL_COMMUNITY)
Admission: EM | Admit: 2014-04-25 | Discharge: 2014-04-25 | Disposition: A | Discharge status: Home / Self Care | Payer: Self-pay | Attending: Emergency Medicine | Admitting: Emergency Medicine

## 2014-04-25 ENCOUNTER — Encounter (HOSPITAL_COMMUNITY): Payer: Self-pay

## 2014-04-25 DIAGNOSIS — R110 Nausea: Secondary | ICD-10-CM

## 2014-04-25 DIAGNOSIS — Z862 Personal history of diseases of the blood and blood-forming organs and certain disorders involving the immune mechanism: Secondary | ICD-10-CM | POA: Insufficient documentation

## 2014-04-25 DIAGNOSIS — Z880 Allergy status to penicillin: Secondary | ICD-10-CM | POA: Insufficient documentation

## 2014-04-25 DIAGNOSIS — Z8669 Personal history of other diseases of the nervous system and sense organs: Secondary | ICD-10-CM | POA: Insufficient documentation

## 2014-04-25 DIAGNOSIS — Z8547 Personal history of malignant neoplasm of testis: Secondary | ICD-10-CM | POA: Insufficient documentation

## 2014-04-25 DIAGNOSIS — Z8719 Personal history of other diseases of the digestive system: Secondary | ICD-10-CM | POA: Insufficient documentation

## 2014-04-25 DIAGNOSIS — I10 Essential (primary) hypertension: Secondary | ICD-10-CM | POA: Insufficient documentation

## 2014-04-25 DIAGNOSIS — N200 Calculus of kidney: Secondary | ICD-10-CM | POA: Insufficient documentation

## 2014-04-25 DIAGNOSIS — Z9049 Acquired absence of other specified parts of digestive tract: Secondary | ICD-10-CM | POA: Insufficient documentation

## 2014-04-25 LAB — URINALYSIS, ROUTINE W REFLEX MICROSCOPIC
Bilirubin Urine: NEGATIVE
Glucose, UA: NEGATIVE mg/dL
Ketones, ur: NEGATIVE mg/dL
Leukocytes, UA: NEGATIVE
NITRITE: NEGATIVE
Protein, ur: NEGATIVE mg/dL
SPECIFIC GRAVITY, URINE: 1.02 (ref 1.005–1.030)
UROBILINOGEN UA: 0.2 mg/dL (ref 0.0–1.0)
pH: 8 (ref 5.0–8.0)

## 2014-04-25 LAB — BASIC METABOLIC PANEL
Anion gap: 6 (ref 5–15)
BUN: 13 mg/dL (ref 6–23)
CHLORIDE: 107 mmol/L (ref 96–112)
CO2: 26 mmol/L (ref 19–32)
CREATININE: 1.31 mg/dL (ref 0.50–1.35)
Calcium: 9.2 mg/dL (ref 8.4–10.5)
GFR calc non Af Amer: 66 mL/min — ABNORMAL LOW (ref >90)
GFR, EST AFRICAN AMERICAN: 77 mL/min — AB (ref >90)
Glucose, Bld: 99 mg/dL (ref 70–99)
Potassium: 4.1 mmol/L (ref 3.5–5.1)
Sodium: 139 mmol/L (ref 135–145)

## 2014-04-25 LAB — URINE MICROSCOPIC-ADD ON

## 2014-04-25 MED ORDER — SODIUM CHLORIDE 0.9 % IV BOLUS (SEPSIS)
1000.0000 mL | Freq: Once | INTRAVENOUS | Status: AC
  Administered 2014-04-25: 1000 mL via INTRAVENOUS

## 2014-04-25 MED ORDER — FENTANYL CITRATE 0.05 MG/ML IJ SOLN
50.0000 ug | Freq: Once | INTRAMUSCULAR | Status: AC
  Administered 2014-04-25: 50 ug via INTRAVENOUS
  Filled 2014-04-25: qty 2

## 2014-04-25 MED ORDER — KETOROLAC TROMETHAMINE 30 MG/ML IJ SOLN
30.0000 mg | Freq: Once | INTRAMUSCULAR | Status: AC
  Administered 2014-04-25: 30 mg via INTRAVENOUS
  Filled 2014-04-25: qty 1

## 2014-04-25 MED ORDER — HYDROMORPHONE HCL 1 MG/ML IJ SOLN
1.0000 mg | Freq: Once | INTRAMUSCULAR | Status: AC
  Administered 2014-04-25: 1 mg via INTRAVENOUS
  Filled 2014-04-25: qty 1

## 2014-04-25 MED ORDER — OXYCODONE-ACETAMINOPHEN 10-325 MG PO TABS: 1.0000 | ORAL_TABLET | Freq: Three times a day (TID) | ORAL | Status: DC | PRN

## 2014-04-25 MED ORDER — TAMSULOSIN HCL 0.4 MG PO CAPS: 0.4000 mg | ORAL_CAPSULE | Freq: Every day | ORAL | Status: DC

## 2014-04-25 MED ORDER — ONDANSETRON HCL 4 MG/2ML IJ SOLN
4.0000 mg | Freq: Once | INTRAMUSCULAR | Status: AC
  Administered 2014-04-25: 4 mg via INTRAVENOUS
  Filled 2014-04-25: qty 2

## 2014-04-25 MED ORDER — TAMSULOSIN HCL 0.4 MG PO CAPS
0.4000 mg | ORAL_CAPSULE | Freq: Every day | ORAL | Status: DC
  Administered 2014-04-25 (×2): 0.4 mg via ORAL
  Filled 2014-04-25: qty 1

## 2014-04-25 NOTE — ED Provider Notes (Signed)
CSN: [id number]     Arrival date & time 04/25/14  1128 History   First MD Initiated Contact with Patient 04/25/14 1250     Chief Complaint  Patient presents with  . Flank Pain    HPI  Patient presents with concern of ongoing left flank pain. The pain has been present for at least one week, worse over the past few days. Pain is radiating from the left flank towards the left hemiscrotum, without scrotum or penis swelling.  No dysuria.  There is decreased urine production. No fever, chills, though there is nausea. Patient is scheduled for urology follow-up in 3 days. Patient was recently diagnosed with 6.7, meter, left proximal ureter stone.  Mild relief with home narcotic use   Past Medical History  Diagnosis Date  . Cancer     testicular cancer in 2007  . Seizures   . Hypertension   . GERD (gastroesophageal reflux disease)    Past Surgical History  Procedure Laterality Date  . Right testical removed  2007  . Appendectomy  2013 ruptured  . Esophagogastroduodenoscopy N/A 12/16/2012    Procedure: ESOPHAGOGASTRODUODENOSCOPY (EGD);  Surgeon: Rogene Houston, MD;  Location[facility];  Service: Endoscopy;  Laterality: N/A;  255   History reviewed. No pertinent family history. History  Substance Use Topics  . Smoking status: Never Smoker   . Smokeless tobacco: Not on file  . Alcohol Use: No    Review of Systems  Constitutional:       Per HPI, otherwise negative  HENT:       Per HPI, otherwise negative  Respiratory:       Per HPI, otherwise negative  Cardiovascular:       Per HPI, otherwise negative  Gastrointestinal: Positive for nausea and vomiting.  Endocrine:       Negative aside from HPI  Genitourinary:       Neg aside from HPI   Musculoskeletal:       Per HPI, otherwise negative  Skin: Negative.   Neurological: Negative for syncope.      Allergies  Penicillins and Morphine and related  Home Medications   Prior to Admission medications   Medication  Sig Start Date End Date Taking? Authorizing Provider  ciprofloxacin (CIPRO) 500 MG tablet Take 1 tablet (500 mg total) by mouth every 12 (twelve) hours. 04/21/14  Yes [name], MD  ciprofloxacin (CIPRO) 500 MG tablet Take 1 tablet (500 mg total) by mouth once. Patient not taking: Reported on 04/20/2014 11/19/13   Veryl Speak, MD  HYDROcodone-acetaminophen (NORCO) 5-325 MG per tablet Take 1-2 tablets by mouth every 6 (six) hours as needed. Patient not taking: Reported on 04/25/2014 11/19/13   Veryl Speak, MD  oxyCODONE-acetaminophen (PERCOCET) 5-325 MG per tablet Take 2 tablets by mouth every 4 (four) hours as needed. Patient not taking: Reported on 04/25/2014 04/21/14   Veryl Speak, MD  oxyCODONE-acetaminophen (PERCOCET/ROXICET) 5-325 MG per tablet Take 1-2 tablets by mouth every 4 (four) hours as needed. Patient not taking: Reported on 04/25/2014 04/21/14   [name], MD   BP 123/87 mmHg  Pulse 79  Temp(Src) 98.7 F (37.1 C) (Oral)  Resp 18  Ht 5\' 9"  (1.753 m)  Wt 208 lb (94.348 kg)  BMI 30.70 kg/m2  SpO2 95% Physical Exam  Constitutional: He is oriented to person, place, and time. He appears well-developed. No distress.  HENT:  Head: Normocephalic and atraumatic.  Eyes: Conjunctivae and EOM are normal.  Cardiovascular: Normal rate and regular  rhythm.   Pulmonary/Chest: Effort normal. No stridor. No respiratory distress.  Abdominal: He exhibits no distension. There is tenderness.    Musculoskeletal: He exhibits no edema.  Neurological: He is alert and oriented to person, place, and time.  Skin: Skin is warm and dry.  Psychiatric: He has a normal mood and affect.  Nursing note and vitals reviewed.   ED Course  Procedures (including critical care time) Labs Review Labs Reviewed  URINALYSIS, ROUTINE W REFLEX MICROSCOPIC - Abnormal; Notable for the following:    Hgb urine dipstick SMALL (*)    All other components within normal limits  URINE MICROSCOPIC-ADD ON  BASIC  METABOLIC PANEL    After the initial evaluation reviewed the patient's chart, including recent CT abdomen pelvis with evidence for left kidney stone.  On repeat exam after 2 doses of narcotics the patient appears calm, no distress. Patient has produced urine. We discussed all findings, including urinalysis, with evidence for stone, but not infection. Patient reiterates that he will follow-up with urology. Patient has not been prescribed Flomax thus far.   MDM  Patient presents with concern of left lateral abdominal pain.  Patient has previously diagnosed kidney stone, and today has a normal soft, non-peritoneal abdomen, is afebrile, awake and alert, presenting urine. Either infection or complete obstruction is likely. Patient instructed to pain reduction with multiple doses of narcotics, fluids,    antirheumatic, was discharged in stable condition.  Carmin Muskrat, MD 04/25/14 [phone]

## 2014-04-25 NOTE — Discharge Instructions (Signed)
As discussed, it is important that you follow up as soon as scheduled with your physician for continued management of your condition.  If you develop any new, or concerning changes in your condition, please return to the emergency department immediately.   Kidney Stones Kidney stones (urolithiasis) are deposits that form inside your kidneys. The intense pain is caused by the stone moving through the urinary tract. When the stone moves, the ureter goes into spasm around the stone. The stone is usually passed in the urine.  CAUSES   A disorder that makes certain neck glands produce too much parathyroid hormone (primary hyperparathyroidism).  A buildup of uric acid crystals, similar to gout in your joints.  Narrowing (stricture) of the ureter.  A kidney obstruction present at birth (congenital obstruction).  Previous surgery on the kidney or ureters.  Numerous kidney infections. SYMPTOMS   Feeling sick to your stomach (nauseous).  Throwing up (vomiting).  Blood in the urine (hematuria).  Pain that usually spreads (radiates) to the groin.  Frequency or urgency of urination. DIAGNOSIS   Taking a history and physical exam.  Blood or urine tests.  CT scan.  Occasionally, an examination of the inside of the urinary bladder (cystoscopy) is performed. TREATMENT   Observation.  Increasing your fluid intake.  Extracorporeal shock wave lithotripsy--This is a noninvasive procedure that uses shock waves to break up kidney stones.  Surgery may be needed if you have severe pain or persistent obstruction. There are various surgical procedures. Most of the procedures are performed with the use of small instruments. Only small incisions are needed to accommodate these instruments, so recovery time is minimized. The size, location, and chemical composition are all important variables that will determine the proper choice of action for you. Talk to your health care provider to better  understand your situation so that you will minimize the risk of injury to yourself and your kidney.  HOME CARE INSTRUCTIONS   Drink enough water and fluids to keep your urine clear or pale yellow. This will help you to pass the stone or stone fragments.  Strain all urine through the provided strainer. Keep all particulate matter and stones for your health care provider to see. The stone causing the pain may be as small as a grain of salt. It is very important to use the strainer each and every time you pass your urine. The collection of your stone will allow your health care provider to analyze it and verify that a stone has actually passed. The stone analysis will often identify what you can do to reduce the incidence of recurrences.  Only take over-the-counter or prescription medicines for pain, discomfort, or fever as directed by your health care provider.  Make a follow-up appointment with your health care provider as directed.  Get follow-up X-rays if required. The absence of pain does not always mean that the stone has passed. It may have only stopped moving. If the urine remains completely obstructed, it can cause loss of kidney function or even complete destruction of the kidney. It is your responsibility to make sure X-rays and follow-ups are completed. Ultrasounds of the kidney can show blockages and the status of the kidney. Ultrasounds are not associated with any radiation and can be performed easily in a matter of minutes. SEEK MEDICAL CARE IF:  You experience pain that is progressive and unresponsive to any pain medicine you have been prescribed. SEEK IMMEDIATE MEDICAL CARE IF:   Pain cannot be controlled with the prescribed  medicine.  You have a fever or shaking chills.  The severity or intensity of pain increases over 18 hours and is not relieved by pain medicine.  You develop a new onset of abdominal pain.  You feel faint or pass out.  You are unable to urinate. MAKE SURE  YOU:   Understand these instructions.  Will watch your condition.  Will get help right away if you are not doing well or get worse. Document Released: 01/27/2005 Document Revised: 09/29/2012 Document Reviewed: 06/30/2012 [facility] Patient Information 2015 [place], [place]. This information is not intended to replace advice given to you by your health care provider. Make sure you discuss any questions you have with your health care provider.

## 2014-04-25 NOTE — ED Notes (Signed)
Patient with no complaints at this time. Respirations even and unlabored. Skin warm/dry. Discharge instructions reviewed with patient at this time. Patient given opportunity to voice concerns/ask questions. IV removed per policy and band-aid applied to site. Patient discharged at this time and left Emergency Department with steady gait.  

## 2014-04-25 NOTE — ED Notes (Signed)
Patient seen here for Thursday for 6.56mm stone. Patient presents with recurrent left flank pain today. Denies hematuria, states could not urinate yesterday for around 12 hours.

## 2014-04-26 MED FILL — Oxycodone w/ Acetaminophen Tab 5-325 MG: ORAL | Qty: 6 | Status: AC

## 2014-04-28 ENCOUNTER — Other Ambulatory Visit: Payer: Self-pay | Admitting: Urology

## 2014-04-28 ENCOUNTER — Ambulatory Visit (HOSPITAL_COMMUNITY): Payer: Self-pay | Admitting: Anesthesiology

## 2014-04-28 ENCOUNTER — Encounter (HOSPITAL_COMMUNITY): Payer: Self-pay | Admitting: *Deleted

## 2014-04-28 ENCOUNTER — Ambulatory Visit (HOSPITAL_COMMUNITY): Payer: Self-pay

## 2014-04-28 ENCOUNTER — Encounter (HOSPITAL_COMMUNITY)
Admission: RE | Disposition: A | Discharge status: Home / Self Care | Payer: Self-pay | Source: Ambulatory Visit | Attending: Urology

## 2014-04-28 ENCOUNTER — Ambulatory Visit (HOSPITAL_COMMUNITY)
Admission: RE | Admit: 2014-04-28 | Discharge: 2014-04-28 | Disposition: A | Discharge status: Home / Self Care | Payer: Self-pay | Source: Ambulatory Visit | Attending: Urology | Admitting: Urology

## 2014-04-28 ENCOUNTER — Ambulatory Visit (INDEPENDENT_AMBULATORY_CARE_PROVIDER_SITE_OTHER): Payer: Self-pay | Admitting: Urology

## 2014-04-28 DIAGNOSIS — N201 Calculus of ureter: Secondary | ICD-10-CM | POA: Insufficient documentation

## 2014-04-28 HISTORY — PX: HOLMIUM LASER APPLICATION: SHX5852

## 2014-04-28 HISTORY — PX: CYSTOSCOPY W/ URETERAL STENT PLACEMENT: SHX1429

## 2014-04-28 HISTORY — PX: URETEROSCOPY: SHX842

## 2014-04-28 SURGERY — CYSTOSCOPY, WITH RETROGRADE PYELOGRAM AND URETERAL STENT INSERTION
Anesthesia: General | Laterality: Left

## 2014-04-28 MED ORDER — SUCCINYLCHOLINE CHLORIDE 20 MG/ML IJ SOLN
INTRAMUSCULAR | Status: AC
  Filled 2014-04-28: qty 1

## 2014-04-28 MED ORDER — ONDANSETRON HCL 4 MG/2ML IJ SOLN
4.0000 mg | Freq: Once | INTRAMUSCULAR | Status: AC | PRN
  Administered 2014-04-28: 4 mg via INTRAVENOUS
  Filled 2014-04-28: qty 2

## 2014-04-28 MED ORDER — NALOXONE HCL 0.4 MG/ML IJ SOLN
INTRAMUSCULAR | Status: AC
  Filled 2014-04-28: qty 1

## 2014-04-28 MED ORDER — ONDANSETRON HCL 4 MG/2ML IJ SOLN
4.0000 mg | Freq: Once | INTRAMUSCULAR | Status: AC
  Administered 2014-04-28: 4 mg via INTRAVENOUS

## 2014-04-28 MED ORDER — FENTANYL CITRATE 0.05 MG/ML IJ SOLN
50.0000 ug | Freq: Once | INTRAMUSCULAR | Status: AC
  Administered 2014-04-28: 50 ug via INTRAVENOUS

## 2014-04-28 MED ORDER — FENTANYL CITRATE 0.05 MG/ML IJ SOLN: 25.0000 ug | INTRAMUSCULAR | Status: DC | PRN

## 2014-04-28 MED ORDER — SODIUM CHLORIDE 0.9 % IJ SOLN: 3.0000 mL | Freq: Two times a day (BID) | INTRAMUSCULAR | Status: DC

## 2014-04-28 MED ORDER — LACTATED RINGERS IV SOLN
INTRAVENOUS | Status: DC
  Administered 2014-04-28 (×2): via INTRAVENOUS

## 2014-04-28 MED ORDER — ONDANSETRON HCL 4 MG/2ML IJ SOLN
INTRAMUSCULAR | Status: AC
  Filled 2014-04-28: qty 2

## 2014-04-28 MED ORDER — FENTANYL CITRATE 0.05 MG/ML IJ SOLN
INTRAMUSCULAR | Status: AC
  Filled 2014-04-28: qty 2

## 2014-04-28 MED ORDER — FENTANYL CITRATE 0.05 MG/ML IJ SOLN
INTRAMUSCULAR | Status: AC
  Filled 2014-04-28: qty 5

## 2014-04-28 MED ORDER — CIPROFLOXACIN IN D5W 400 MG/200ML IV SOLN
INTRAVENOUS | Status: AC
  Filled 2014-04-28: qty 200

## 2014-04-28 MED ORDER — ACETAMINOPHEN 650 MG RE SUPP
650.0000 mg | RECTAL | Status: DC | PRN
  Filled 2014-04-28: qty 1

## 2014-04-28 MED ORDER — LIDOCAINE HCL (CARDIAC) 10 MG/ML IV SOLN
INTRAVENOUS | Status: DC | PRN
  Administered 2014-04-28: 30 mg via INTRAVENOUS

## 2014-04-28 MED ORDER — HYDROMORPHONE HCL 4 MG PO TABS: 4.0000 mg | ORAL_TABLET | Freq: Four times a day (QID) | ORAL | Status: DC | PRN

## 2014-04-28 MED ORDER — LIDOCAINE HCL (PF) 1 % IJ SOLN
INTRAMUSCULAR | Status: AC
  Filled 2014-04-28: qty 5

## 2014-04-28 MED ORDER — HYDROMORPHONE HCL 1 MG/ML IJ SOLN
0.2500 mg | INTRAMUSCULAR | Status: DC | PRN
  Administered 2014-04-28 (×2): 0.5 mg via INTRAVENOUS

## 2014-04-28 MED ORDER — ACETAMINOPHEN 325 MG PO TABS: 650.0000 mg | ORAL_TABLET | ORAL | Status: DC | PRN

## 2014-04-28 MED ORDER — MIDAZOLAM HCL 2 MG/2ML IJ SOLN
1.0000 mg | INTRAMUSCULAR | Status: DC | PRN
  Administered 2014-04-28 (×2): 2 mg via INTRAVENOUS
  Filled 2014-04-28: qty 2

## 2014-04-28 MED ORDER — STERILE WATER FOR IRRIGATION IR SOLN
Status: DC | PRN
  Administered 2014-04-28: 3000 mL

## 2014-04-28 MED ORDER — HYDROMORPHONE HCL 1 MG/ML IJ SOLN
INTRAMUSCULAR | Status: AC
  Filled 2014-04-28: qty 1

## 2014-04-28 MED ORDER — FENTANYL CITRATE 0.05 MG/ML IJ SOLN: INTRAMUSCULAR | Status: DC | PRN

## 2014-04-28 MED ORDER — CIPROFLOXACIN IN D5W 400 MG/200ML IV SOLN
400.0000 mg | INTRAVENOUS | Status: AC
  Administered 2014-04-28: 400 mg via INTRAVENOUS

## 2014-04-28 MED ORDER — ROCURONIUM BROMIDE 100 MG/10ML IV SOLN
INTRAVENOUS | Status: DC | PRN
  Administered 2014-04-28: 5 mg via INTRAVENOUS

## 2014-04-28 MED ORDER — SODIUM CHLORIDE 0.9 % IJ SOLN: 3.0000 mL | INTRAMUSCULAR | Status: DC | PRN

## 2014-04-28 MED ORDER — FENTANYL CITRATE 0.05 MG/ML IJ SOLN
INTRAMUSCULAR | Status: DC | PRN
  Administered 2014-04-28: 100 ug via INTRAVENOUS
  Administered 2014-04-28: 50 ug via INTRAVENOUS

## 2014-04-28 MED ORDER — FENTANYL CITRATE 0.05 MG/ML IJ SOLN
25.0000 ug | INTRAMUSCULAR | Status: AC
  Administered 2014-04-28 (×2): 25 ug via INTRAVENOUS

## 2014-04-28 MED ORDER — MIDAZOLAM HCL 2 MG/2ML IJ SOLN
INTRAMUSCULAR | Status: AC
  Filled 2014-04-28: qty 2

## 2014-04-28 MED ORDER — OXYCODONE HCL 5 MG PO TABS: 5.0000 mg | ORAL_TABLET | ORAL | Status: DC | PRN

## 2014-04-28 MED ORDER — LIDOCAINE HCL 2 % EX GEL
CUTANEOUS | Status: DC | PRN
  Administered 2014-04-28: 1 via URETHRAL

## 2014-04-28 MED ORDER — SODIUM CHLORIDE 0.9 % IR SOLN
Status: DC | PRN
  Administered 2014-04-28: 3000 mL

## 2014-04-28 MED ORDER — LIDOCAINE HCL 2 % EX GEL
CUTANEOUS | Status: AC
  Filled 2014-04-28: qty 10

## 2014-04-28 MED ORDER — PROPOFOL 10 MG/ML IV BOLUS
INTRAVENOUS | Status: AC
  Filled 2014-04-28: qty 20

## 2014-04-28 MED ORDER — SUCCINYLCHOLINE CHLORIDE 20 MG/ML IJ SOLN
INTRAMUSCULAR | Status: DC | PRN
  Administered 2014-04-28: 120 mg via INTRAVENOUS

## 2014-04-28 MED ORDER — PHENAZOPYRIDINE HCL 200 MG PO TABS: 200.0000 mg | ORAL_TABLET | Freq: Three times a day (TID) | ORAL | Status: DC | PRN

## 2014-04-28 MED ORDER — PROPOFOL 10 MG/ML IV BOLUS
INTRAVENOUS | Status: DC | PRN
Start: 2014-04-28 — End: 2014-04-28
  Administered 2014-04-28: 10 mg via INTRAVENOUS
  Administered 2014-04-28: 50 mg via INTRAVENOUS
  Administered 2014-04-28: 20 mg via INTRAVENOUS
  Administered 2014-04-28: 10 mg via INTRAVENOUS
  Administered 2014-04-28: 40 mg via INTRAVENOUS
  Administered 2014-04-28 (×2): 10 mg via INTRAVENOUS
  Administered 2014-04-28: 180 mg via INTRAVENOUS

## 2014-04-28 MED ORDER — IOHEXOL 350 MG/ML SOLN
INTRAVENOUS | Status: DC | PRN
  Administered 2014-04-28: 50 mL

## 2014-04-28 MED ORDER — KETOROLAC TROMETHAMINE 30 MG/ML IJ SOLN: 30.0000 mg | INTRAMUSCULAR | Status: DC

## 2014-04-28 MED ORDER — SODIUM CHLORIDE 0.9 % IV SOLN: 250.0000 mL | INTRAVENOUS | Status: DC | PRN

## 2014-04-28 SURGICAL SUPPLY — 23 items
BAG DRAIN URO TABLE W/ADPT NS (DRAPE) ×3 IMPLANT
BAG DRN 8 ADPR NS SKTRN CSTL (DRAPE) ×1
BAG HAMPER (MISCELLANEOUS) ×3 IMPLANT
BASKET ZERO TIP NITINOL 2.4FR (BASKET) ×2 IMPLANT
BSKT STON RTRVL ZERO TP 2.4FR (BASKET) ×1
CATH URET 5FR 28IN OPEN ENDED (CATHETERS) ×3 IMPLANT
CLOTH BEACON ORANGE TIMEOUT ST (SAFETY) ×6 IMPLANT
GLOVE BIOGEL PI IND STRL 7.0 (GLOVE) IMPLANT
GLOVE BIOGEL PI INDICATOR 7.0 (GLOVE) ×2
GLOVE ECLIPSE 6.5 STRL STRAW (GLOVE) ×2 IMPLANT
GLOVE SURG SS PI 8.0 STRL IVOR (GLOVE) ×3 IMPLANT
GOWN STRL REUS W/TWL XL LVL3 (GOWN DISPOSABLE) ×2 IMPLANT
GUIDEWIRE STR DUAL SENSOR (WIRE) ×4 IMPLANT
IV NS IRRIG 3000ML ARTHROMATIC (IV SOLUTION) ×2 IMPLANT
KIT ROOM TURNOVER AP CYSTO (KITS) ×3 IMPLANT
LASER FIBER DISP (UROLOGICAL SUPPLIES) ×2 IMPLANT
MANIFOLD NEPTUNE II (INSTRUMENTS) ×3 IMPLANT
PACK CYSTO (CUSTOM PROCEDURE TRAY) ×3 IMPLANT
PAD ARMBOARD 7.5X6 YLW CONV (MISCELLANEOUS) ×3 IMPLANT
SHEATH URET ACCESS 12FR/35CM (UROLOGICAL SUPPLIES) ×2 IMPLANT
STENT CONTOUR 6FRX26X.038 (STENTS) ×2 IMPLANT
WATER STERILE IRR 1000ML POUR (IV SOLUTION) ×3 IMPLANT
WATER STERILE IRR 3000ML UROMA (IV SOLUTION) ×4 IMPLANT

## 2014-04-28 NOTE — Anesthesia Preprocedure Evaluation (Deleted)

## 2014-04-28 NOTE — Anesthesia Postprocedure Evaluation (Signed)
  Anesthesia Post-op Note  Patient: Larry Hoover  Procedure(s) Performed: Procedure(s): CYSTOSCOPY WITH LEFT  RETROGRADE PYELOGRAM LEFT URETERAL STENT PLACEMENT (Left) URETEROSCOPY (Left) HOLMIUM LASER APPLICATION (Left)  Patient Location: Short Stay  Anesthesia Type:General  Level of Consciousness: awake, alert  and patient cooperative  Airway and Oxygen Therapy: Patient Spontanous Breathing  Post-op Pain: moderate  Post-op Assessment: Post-op Vital signs reviewed, Patient's Cardiovascular Status Stable, Respiratory Function Stable, Patent Airway and No signs of Nausea or vomiting  Post-op Vital Signs: Reviewed and stable  Last Vitals:  Filed Vitals:   04/28/14 1521  BP: 145/80  Pulse: 80  Temp:   Resp: 16    Complications: No apparent anesthesia complications

## 2014-04-28 NOTE — Anesthesia Procedure Notes (Addendum)
Procedure Name: Intubation Date/Time: 04/28/2014 1:06 PM Performed by: Vista Deck Pre-anesthesia Checklist: Patient identified, Emergency Drugs available, Suction available, Patient being monitored and Timeout performed Patient Re-evaluated:Patient Re-evaluated prior to inductionOxygen Delivery Method: Circle system utilized Preoxygenation: Pre-oxygenation with 100% oxygen Intubation Type: IV induction Ventilation: Oral airway inserted - appropriate to patient size Laryngoscope Size: Glidescope and 3 Grade View: Grade I Tube type: Oral Tube size: 8.0 mm Number of attempts: 2 Airway Equipment and Method: Video-laryngoscopy and Stylet Placement Confirmation: ETT inserted through vocal cords under direct vision,  positive ETCO2 and breath sounds checked- equal and bilateral Secured at: 22 cm Tube secured with: Tape Dental Injury: Teeth and Oropharynx as per pre-operative assessment  Comments: One look Mil 2., unable to easily pass Ett from this view secondary to Poor dentition/position. Switched to Glidescope and easy intubation; no dental damage noted

## 2014-04-28 NOTE — Progress Notes (Signed)
Patient attempted to have bowel movement before discharge without results.  He will ambulate with assistance during his postoperative recuperation at home.  He will contact MD if persists unable to pass bowel movement or flatus.

## 2014-04-28 NOTE — Progress Notes (Addendum)
Dr Ralene Muskrat office nurse gave Toradol and phenergan in office today before patient came in for surgery.  Toradol order cancelled. Rosalyn Gess RN, BSN, CNOR, [employer]

## 2014-04-28 NOTE — Transfer of Care (Signed)
Immediate Anesthesia Transfer of Care Note  Patient: Larry Hoover  Procedure(s) Performed: Procedure(s): CYSTOSCOPY WITH LEFT  RETROGRADE PYELOGRAM LEFT URETERAL STENT PLACEMENT (Left) URETEROSCOPY (Left) HOLMIUM LASER APPLICATION (Left)  Patient Location: PACU  Anesthesia Type:General  Level of Consciousness: sedated and patient cooperative  Airway & Oxygen Therapy: Patient Spontanous Breathing and non-rebreather face mask  Post-op Assessment: Report given to RN, Post -op Vital signs reviewed and stable and Patient moving all extremities  Post vital signs: Reviewed and stable    Complications: No apparent anesthesia complications

## 2014-04-28 NOTE — Anesthesia Preprocedure Evaluation (Signed)
Anesthesia Evaluation  Patient identified by MRN, date of birth, ID band Patient awake    Reviewed: Allergy & Precautions, NPO status , Patient's Chart, lab work & pertinent test results  Airway Mallampati: I  TM Distance: >3 FB     Dental  (+) Poor Dentition, Missing, Loose, Dental Advisory Given,    Pulmonary sleep apnea ,  breath sounds clear to auscultation        Cardiovascular hypertension (no meds x yrs), Rhythm:Regular Rate:Normal     Neuro/Psych Seizures -, Poorly Controlled,  PSYCHIATRIC DISORDERS Depression    GI/Hepatic PUD, GERD-  Medicated and Controlled,(+)     substance abuse  marijuana use,   Endo/Other    Renal/GU      Musculoskeletal   Abdominal   Peds  Hematology   Anesthesia Other Findings Chronic narcotic + benzodiazapine NPO since 7am  Reproductive/Obstetrics                             Anesthesia Physical Anesthesia Plan  ASA: III  Anesthesia Plan: General   Post-op Pain Management:    Induction: Intravenous, Rapid sequence and Cricoid pressure planned  Airway Management Planned: Oral ETT  Additional Equipment:   Intra-op Plan:   Post-operative Plan: Extubation in OR  Informed Consent: I have reviewed the patients History and Physical, chart, labs and discussed the procedure including the risks, benefits and alternatives for the proposed anesthesia with the patient or authorized representative who has indicated his/her understanding and acceptance.     Plan Discussed with:   Anesthesia Plan Comments:         Anesthesia Quick Evaluation

## 2014-04-28 NOTE — H&P (Signed)
Active Problems  1. Calculus of left ureter (N20.1)  History of Present Illness   Larry Hoover is a 44 yo WM who had the onset a week ago of severe left flank pain.  He was seen in the ER and a CT showed a 6x73mm left proximal stone.  He has had continued pain with nausea and vomiting and he has not had a BM in a week.  He has no voiding complaints.  He has no hematuria.  He has passed a couple of stones remotely.  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   Past Medical History  1. History of Anxiety (F41.9)  2. History of depression (Z86.59)  3. History of esophageal reflux (Z87.19)  4. History of hypertension (Z86.79)  5. History of malignant neoplasm of testis (Z85.47)  6. History of migraine headaches (Z86.69)  7. History of renal calculi (Z87.442)  8. History of sleep apnea (Z87.09)  9. History of Seizures (R56.9)  Surgical History  1. History of Appendectomy  2. History of Surgery Testis  Current Meds  1. Flomax 0.4 MG Oral Capsule;  Therapy: (Recorded:18Mar2016) to Recorded  2. Percocet 10-325 MG Oral Tablet;  Therapy: (Recorded:18Mar2016) to Recorded  Allergies  1. Morphine Derivatives  2. Penicillins  Family History  1. Family history of Coronary artery disease  2. Family history of stroke (Z82.3)  Social History   Caffeine use (F15.90)   Divorced   Denied: History of Tobacco use  Review of Systems Genitourinary, constitutional, skin, eye, otolaryngeal, hematologic/lymphatic, cardiovascular, pulmonary, endocrine, musculoskeletal, gastrointestinal, neurological and psychiatric system(s) were reviewed and pertinent findings if present are noted and are otherwise negative.  Genitourinary: nocturia, difficulty starting the urinary stream, weak urinary stream and urinary stream starts and stops.  Gastrointestinal: nausea, vomiting, heartburn and constipation.  Constitutional: night sweats and feeling tired (fatigue).   Integumentary: pruritus.  Eyes: blurred vision.  ENT: sinus problems.  Hematologic/Lymphatic: a tendency to easily bruise.  Cardiovascular: chest pain.  Respiratory: shortness of breath.  Endocrine: polydipsia.  Musculoskeletal: back pain and joint pain.  Neurological: headache and dizziness.  Psychiatric: anxiety and depression.    Vitals Vital Signs [Data Includes: Last 1 Day]  Recorded: [id number] 08:18AM  Height: 5 ft 9 in Weight: 203 lb  BMI Calculated: 29.98 BSA Calculated: 2.08 Blood Pressure: 142 / 99 Temperature: 97.6 F Heart Rate: 88  Physical Exam Constitutional: Well nourished and well developed . In acute distress.   ENT:. The ears and nose are normal in appearance.   Neck: The appearance of the neck is normal and no neck mass is present.   Pulmonary: No respiratory distress and normal respiratory rhythm and effort.   Cardiovascular: Heart rate and rhythm are normal . No peripheral edema.   Abdomen: The abdomen is flat. No masses are palpated. Severe tenderness in the LLQ is present. severe left CVA tenderness. No hernias are palpable. No hepatosplenomegaly noted.   Lymphatics: The posterior cervical and supraclavicular nodes are not enlarged or tender.   Skin: Normal skin turgor, no visible rash and no visible skin lesions.   Neuro/Psych:. Mood and affect are appropriate.    Results/Data Urine [Data Includes: Last 1 Day]   [id number]  COLOR YELLOW   APPEARANCE CLEAR   SPECIFIC GRAVITY 1.020   pH 6.0   GLUCOSE NEG mg/dL  BILIRUBIN NEG   KETONE NEG mg/dL  BLOOD SMALL   PROTEIN NEG mg/dL  UROBILINOGEN 0.2 mg/dL  NITRITE NEG  LEUKOCYTE ESTERASE NEG   SQUAMOUS EPITHELIAL/HPF NONE SEEN   WBC 0-2 WBC/hpf  RBC 0-2 RBC/hpf  BACTERIA RARE   CRYSTALS NONE SEEN   CASTS NONE SEEN    Old records or history reviewed: I have reviewed his hospital records.  The following images/tracing/specimen were independently visualized:  CT films reviewed.  The  following clinical lab reports were reviewed:  UA reviewed.    Assessment  1. Calculus of left ureter (N20.1)   He has a 6x7 left proximal stone with persistent pain.   Plan Nephrolithiasis   1. UA With REFLEX; [Do Not Release]; Status:Hold For - [employer];  Requested for:18Mar2016;    I am going to get him set up to go to the [facility] OR later today for cystoscopy with possible left ureteroscopic stone extraction with holmium lasertripsy and left ureteral stenting.  I reviewed the risks of bleeding, infection, ureteral injury, need for a stent or secondary procedures, thrombotic events and anesthetic risks.   Signatures Electronically signed by : Irine Seal, M.D.; Apr 28 2014  9:19AM EST

## 2014-04-28 NOTE — Brief Op Note (Signed)
04/28/2014  1:49 PM  PATIENT:  Dorothyann Peng  44 y.o. male  PRE-OPERATIVE DIAGNOSIS:  left ureteral stone  POST-OPERATIVE DIAGNOSIS:  left ureteral stone  PROCEDURE:  Procedure(s): CYSTOSCOPY WITH LEFT  RETROGRADE PYELOGRAM LEFT URETERAL STENT PLACEMENT (Left) URETEROSCOPY (Left) HOLMIUM LASER APPLICATION (Left)  SURGEON:  Surgeon(s) and Role:    * Irine Seal, MD - Primary  PHYSICIAN ASSISTANT:   ASSISTANTS: none   ANESTHESIA:   general  EBL:  Total I/O In: 1000 [I.V.:1000] Out: -   BLOOD ADMINISTERED:none  DRAINS: 6 x 26 left JJ stent   LOCAL MEDICATIONS USED:  LIDOCAINE  and Amount: 10 ml  SPECIMEN:  No Specimen  DISPOSITION OF SPECIMEN:  N/A  COUNTS:  YES  TOURNIQUET:  * No tourniquets in log *  DICTATION: .Other Dictation: Dictation Number [id number]  PLAN OF CARE: Discharge to home after PACU  PATIENT DISPOSITION:  PACU - hemodynamically stable.   Delay start of Pharmacological VTE agent (>24hrs) due to surgical blood loss or risk of bleeding: not applicable

## 2014-04-28 NOTE — Discharge Instructions (Addendum)
Ureteral Stent Implantation Ureteral stent implantation is the implantation of a soft plastic tube with multiple holes into the tube that drains urine from your kidney to your bladder (ureter). The stent helps drain your kidney when there is a blockage of the flow of urine in your ureter. The stent has a coil on each end to keep it from falling out. One end stays in the kidney. The other end stays in the bladder. It is most often taken out after any blockage has been removed or your ureter has healed. Short-term stents have a string attached to make removal quite easy. Removal of a short-term stent can be done in your health care provider's office or by you at home. Long-term stents need to be changed every few months. LET [facility] CARE PROVIDER KNOW ABOUT:  Any allergies you have.  All medicines you are taking, including vitamins, herbs, eye drops, creams, and over-the-counter medicines.  Previous problems you or members of your family have had with the use of anesthetics.  Any blood disorders you have.  Previous surgeries you have had.  Medical conditions you have. RISKS AND COMPLICATIONS Generally, ureteral stent implantation is a safe procedure. However, as with any procedure, complications can occur. Possible complications include:  Movement of the stent away from where it was originally placed (migration). This may affect the ability of the stent to properly drain your kidney. If migration of the stent occurs, the stent may need to be replaced or repositioned.  Perforation of the ureter.  Infection. BEFORE THE PROCEDURE  You may be asked to wash your genital area with sterile soap the morning of your procedure.  You may be given an oral antibiotic which you should take with a sip of water as prescribed by your health care provider.  You may be asked to not eat or drink for 8 hours before the surgery. PROCEDURE  First you will be given an anesthetic so you do not feel pain  during the procedure.  Your health care provider will insert a special lighted instrument called a cystoscope into your bladder. This allows your health care provider to see the opening to your ureter.  A thin wire is carefully threaded into your bladder and up the ureter. The stent is inserted over the wire and the wire is then removed.  Your bladder will be emptied of urine. AFTER THE PROCEDURE You will be taken to a recovery room until it is okay for you to go home. Document Released: 01/25/2000 Document Revised: 02/01/2013 Document Reviewed: 07/06/2012 [employer] Patient Information 2015 [place], [place]. This information is not intended to replace advice given to you by your health care provider. Make sure you discuss any questions you have with your health care provider. CYSTOSCOPY HOME CARE INSTRUCTIONS  Activity: Rest for the remainder of the day.  Do not drive or operate equipment today.  You may resume normal activities in one to two days as instructed by your physician.   Meals: Drink plenty of liquids and eat light foods such as gelatin or soup this evening.  You may return to a normal meal plan tomorrow.  Return to Work: You may return to work in one to two days or as instructed by your physician.  Special Instructions / Symptoms: Call your physician if any of these symptoms occur:   -persistent or heavy bleeding  -bleeding which continues after first few urination  -large blood clots that are difficult to pass  -urine stream diminishes or stops completely  -  fever equal to or higher than 101 degrees Farenheit.  -cloudy urine with a strong, foul odor  -severe pain  Females should always wipe from front to back after elimination.  You may feel some burning pain when you urinate.  This should disappear with time.  Applying moist heat to the lower abdomen or a hot tub bath may help relieve the pain. \   Patient Signature:   ________________________________________________________  Nurse's Signature:  ________________________________________________________

## 2014-05-01 ENCOUNTER — Encounter (HOSPITAL_COMMUNITY): Payer: Self-pay | Admitting: Urology

## 2014-05-01 NOTE — Op Note (Signed)
NAMEDAMEL, QUERRY NO.:  [id number]  MEDICAL RECORD NO.:  [id number]  LOCATION:                                 FACILITY:  PHYSICIAN:  Marshall Cork. Jeffie Pollock, M.D.    DATE OF BIRTH:  24-Jan-1971  DATE OF PROCEDURE:  04/28/2014 DATE OF DISCHARGE:  04/28/2014                              OPERATIVE REPORT   PROCEDURE: 1. Cystoscopy with left retrograde pyelogram. 2. Left ureteroscopy with holmium lasertripsy. 3. Insertion of left double-J stent.  PREOPERATIVE DIAGNOSIS:  6 x 7 mm left proximal ureteral stone.  POSTOPERATIVE DIAGNOSIS:  6 x 7 mm left proximal ureteral stone.  SURGEON:  Marshall Cork. Jeffie Pollock, M.D.  ANESTHESIA:  General.  SPECIMEN:  None.  DRAINS:  A 6-French x26 cm left double-J stent.  BLOOD LOSS:  None.  COMPLICATIONS:  None.  INDICATIONS:  Mr. Perrault is a 44 year old white male with a 6 x 7 mm left proximal stone.  He has not had any relief from his pain over the last week.  A KUB today shows no progression of the stone.  It was felt that cystoscopy with attempted ureteroscopy was indicated.  FINDINGS OF PROCEDURE:  He was taken to the operating room where general anesthetic was induced.  He was placed in lithotomy position and fitted with PAS hose.  He was given Cipro.  His perineum and genitalia were prepped with Betadine solution and draped in usual sterile fashion.  Cystoscopy was performed using a 22-French scope and a 12-degree lens.  Examination revealed a normal urethra.  The external sphincter was intact.  The prostatic urethra was short without obstruction. Examination of bladder revealed mild trabeculation.  No tumors, stones, or inflammation noted.  Ureteral orifices were unremarkable.  The left ureteral orifice was cannulated with 5-French open-end catheter and contrast was instilled.  This revealed a normal ureter up to a filling defect in the proximal ureter consistent with a stone with some proximal hydronephrosis.   A  guidewire was then passed through the open-end catheter to the kidney and the open-end catheter and cystoscope were removed.  A 12-French introducer sheath inner core was then used to dilate the ureter up to the level of the stone.  A semi-rigid ureteroscope was then passed to the  level of stone.  The initial scope was insufficient so an offset long scope was used.  I was able to get up to the stone and visualize it well.  The 365-micron laser fiber was then passed through the scope and it was initially set on 0.5 watts and 20 hertz and the stone was engaged.  The power then was increased to 1 watt when the stone resisted  adequate fragmentation.  With a higher power, the stone broke into manageable fragments with none more than approximately 2-3 mm.  An attempt was made to grasp 1 of the smaller fragments due to some ureteral angulation.  I was unable to retrieve it, however, was significantly less than the diameter of the ureter, and it was felt that it would pass with period of stent placement.  The ureteroscope was removed and the cystoscope was reinserted over the guidewire.  A 6-French  26 cm double-J stent without string was passed to the kidney under fluoroscopic guidance.  Wire was removed leaving good coil in the kidney and good coil in the bladder.  The bladder was then drained.  The cystoscope was removed.  The urethra was instilled with 10 mL of 2% lidocaine jelly and a gauze was wrapped around and into the penis to hold it in place.  He was taken down from lithotomy position.  His anesthetic was reversed.  He was moved to the recovery room in stable condition.  He will be sent home with a prescription for Pyridium and Dilaudid and will be scheduled to return in 2-3 weeks for a cystoscopy with stent removal.  There were no complications during the procedure.     Marshall Cork. Jeffie Pollock, M.D.     JJW/MEDQ  D:  04/28/2014  T:  04/29/2014  Job:  [id number]

## 2014-05-07 ENCOUNTER — Telehealth (HOSPITAL_COMMUNITY): Payer: Self-pay

## 2014-05-07 ENCOUNTER — Encounter (HOSPITAL_COMMUNITY): Payer: Self-pay | Admitting: Emergency Medicine

## 2014-05-07 ENCOUNTER — Emergency Department (HOSPITAL_COMMUNITY)
Admission: EM | Admit: 2014-05-07 | Discharge: 2014-05-07 | Disposition: A | Discharge status: Home / Self Care | Payer: Self-pay | Attending: Emergency Medicine | Admitting: Emergency Medicine

## 2014-05-07 ENCOUNTER — Emergency Department (HOSPITAL_COMMUNITY): Payer: Self-pay

## 2014-05-07 DIAGNOSIS — Z79899 Other long term (current) drug therapy: Secondary | ICD-10-CM | POA: Insufficient documentation

## 2014-05-07 DIAGNOSIS — Z8547 Personal history of malignant neoplasm of testis: Secondary | ICD-10-CM | POA: Insufficient documentation

## 2014-05-07 DIAGNOSIS — R6883 Chills (without fever): Secondary | ICD-10-CM | POA: Insufficient documentation

## 2014-05-07 DIAGNOSIS — R0981 Nasal congestion: Secondary | ICD-10-CM | POA: Insufficient documentation

## 2014-05-07 DIAGNOSIS — J3489 Other specified disorders of nose and nasal sinuses: Secondary | ICD-10-CM | POA: Insufficient documentation

## 2014-05-07 DIAGNOSIS — R61 Generalized hyperhidrosis: Secondary | ICD-10-CM | POA: Insufficient documentation

## 2014-05-07 DIAGNOSIS — I10 Essential (primary) hypertension: Secondary | ICD-10-CM | POA: Insufficient documentation

## 2014-05-07 DIAGNOSIS — R112 Nausea with vomiting, unspecified: Secondary | ICD-10-CM | POA: Insufficient documentation

## 2014-05-07 DIAGNOSIS — M549 Dorsalgia, unspecified: Secondary | ICD-10-CM | POA: Insufficient documentation

## 2014-05-07 DIAGNOSIS — Z8719 Personal history of other diseases of the digestive system: Secondary | ICD-10-CM | POA: Insufficient documentation

## 2014-05-07 DIAGNOSIS — Z880 Allergy status to penicillin: Secondary | ICD-10-CM | POA: Insufficient documentation

## 2014-05-07 DIAGNOSIS — R109 Unspecified abdominal pain: Secondary | ICD-10-CM | POA: Insufficient documentation

## 2014-05-07 DIAGNOSIS — R51 Headache: Secondary | ICD-10-CM | POA: Insufficient documentation

## 2014-05-07 LAB — CBC WITH DIFFERENTIAL/PLATELET
BASOS ABS: 0 10*3/uL (ref 0.0–0.1)
Basophils Relative: 0 % (ref 0–1)
EOS PCT: 2 % (ref 0–5)
Eosinophils Absolute: 0.2 10*3/uL (ref 0.0–0.7)
HEMATOCRIT: 46.6 % (ref 39.0–52.0)
HEMOGLOBIN: 16.5 g/dL (ref 13.0–17.0)
LYMPHS PCT: 28 % (ref 12–46)
Lymphs Abs: 2.5 10*3/uL (ref 0.7–4.0)
MCH: 33.1 pg (ref 26.0–34.0)
MCHC: 35.4 g/dL (ref 30.0–36.0)
MCV: 93.6 fL (ref 78.0–100.0)
MONO ABS: 0.6 10*3/uL (ref 0.1–1.0)
Monocytes Relative: 6 % (ref 3–12)
Neutro Abs: 5.7 10*3/uL (ref 1.7–7.7)
Neutrophils Relative %: 64 % (ref 43–77)
Platelets: 246 10*3/uL (ref 150–400)
RBC: 4.98 MIL/uL (ref 4.22–5.81)
RDW: 12.1 % (ref 11.5–15.5)
WBC: 9 10*3/uL (ref 4.0–10.5)

## 2014-05-07 LAB — BASIC METABOLIC PANEL
Anion gap: 8 (ref 5–15)
BUN: 13 mg/dL (ref 6–23)
CALCIUM: 8.5 mg/dL (ref 8.4–10.5)
CHLORIDE: 108 mmol/L (ref 96–112)
CO2: 24 mmol/L (ref 19–32)
Creatinine, Ser: 1.08 mg/dL (ref 0.50–1.35)
GFR calc Af Amer: >90 mL/min (ref >90)
GFR, EST NON AFRICAN AMERICAN: 84 mL/min — AB (ref >90)
Glucose, Bld: 95 mg/dL (ref 70–99)
Potassium: 3.8 mmol/L (ref 3.5–5.1)
SODIUM: 140 mmol/L (ref 135–145)

## 2014-05-07 MED ORDER — SODIUM CHLORIDE 0.9 % IV SOLN
INTRAVENOUS | Status: DC
  Administered 2014-05-07: 13:00:00 via INTRAVENOUS

## 2014-05-07 MED ORDER — SODIUM CHLORIDE 0.9 % IV BOLUS (SEPSIS)
1000.0000 mL | Freq: Once | INTRAVENOUS | Status: AC
  Administered 2014-05-07: 1000 mL via INTRAVENOUS

## 2014-05-07 MED ORDER — HYDROMORPHONE HCL 1 MG/ML IJ SOLN
1.0000 mg | Freq: Once | INTRAMUSCULAR | Status: AC
  Administered 2014-05-07: 1 mg via INTRAVENOUS
  Filled 2014-05-07: qty 1

## 2014-05-07 MED ORDER — HYDROMORPHONE HCL 4 MG PO TABS: 4.0000 mg | ORAL_TABLET | Freq: Four times a day (QID) | ORAL | Status: DC | PRN

## 2014-05-07 MED ORDER — ONDANSETRON HCL 4 MG/2ML IJ SOLN
4.0000 mg | Freq: Once | INTRAMUSCULAR | Status: AC
  Administered 2014-05-07: 4 mg via INTRAVENOUS
  Filled 2014-05-07: qty 2

## 2014-05-07 NOTE — ED Notes (Signed)
Pt c/o left flank pain radiating into abdomen and groin. Pt has known kidney stones and had lithotripsy and stent placed 10 days ago. Pt reports increased pain today and swelling to left testicle. (h/s right orchiopexy d/t testicular cancer). Pt states he ran out of home pain medication Dilaudid 66mp po yesterday and was instructed to come to ED by surgeon today. Pt last urinated 0800 this am with hematuria.

## 2014-05-07 NOTE — ED Provider Notes (Signed)
CSN: [id number]     Arrival date & time 05/07/14  1032 History  This chart was scribed for Larry Sorrow, MD by Edison Simon, ED Scribe. This patient was seen in room APA06/APA06 and the patient's care was started at 11:26 AM.    Chief Complaint  Patient presents with  . Flank Pain   Patient is a 44 y.o. male presenting with flank pain. The history is provided by the patient. No language interpreter was used.  Flank Pain This is a new problem. The current episode started more than 1 week ago. The problem occurs constantly. Associated symptoms include abdominal pain and headaches. Pertinent negatives include no chest pain and no shortness of breath. Nothing aggravates the symptoms. The symptoms are relieved by narcotics. Treatments tried: narcotics. The treatment provided moderate relief.    HPI Comments: AVYON HERENDEEN is a 44 y.o. male with history of kidney stones who presents to the Emergency Department complaining of left flank pain radiating into abdomen and left testicle. He rates pain at 10/10. He states he urinated at 0800 this morning and the flow stopped and he has been unable to urinate. He states he had dysuria and hematuria previously. He reports nausea, vomiting, diaphoresis, and chills. He had lithotripsy and stent placed 9 days ago at [facility]. He was prescribed 30x 4mg  pills and instructed to use every 6-8 hours; he ran out yesterday morning. He has an appointment for follow up on April 5. He notes history of testicular cancer in 2007.   Past Medical History  Diagnosis Date  . Cancer     testicular cancer in 2007  . Seizures   . Hypertension   . GERD (gastroesophageal reflux disease)    Past Surgical History  Procedure Laterality Date  . Right testical removed  2007  . Appendectomy  2013 ruptured  . Esophagogastroduodenoscopy N/A 12/16/2012    Procedure: ESOPHAGOGASTRODUODENOSCOPY (EGD);  Surgeon: Rogene Houston, MD;  Location: [facility];  Service: Endoscopy;   Laterality: N/A;  255  . Cystoscopy w/ ureteral stent placement Left 04/28/2014    Procedure: CYSTOSCOPY WITH LEFT  RETROGRADE PYELOGRAM LEFT URETERAL STENT PLACEMENT;  Surgeon: Irine Seal, MD;  Location: [facility] ORS;  Service: Urology;  Laterality: Left;  . Ureteroscopy Left 04/28/2014    Procedure: URETEROSCOPY;  Surgeon: Irine Seal, MD;  Location: [facility] ORS;  Service: Urology;  Laterality: Left;  . Holmium laser application Left 5/36/1443    Procedure: HOLMIUM LASER APPLICATION;  Surgeon: Irine Seal, MD;  Location: [facility] OR[facility];  Service: Urology;  Laterality: Left;   History reviewed. No pertinent family history. History  Substance Use Topics  . Smoking status: Never Smoker   . Smokeless tobacco: Not on file  . Alcohol Use: No    Review of Systems  Constitutional: Positive for chills and diaphoresis. Negative for fever.  HENT: Positive for congestion and rhinorrhea. Negative for sore throat.   Eyes: Negative for visual disturbance.  Respiratory: Negative for cough and shortness of breath.   Cardiovascular: Negative for chest pain and leg swelling.  Gastrointestinal: Positive for nausea, vomiting and abdominal pain. Negative for diarrhea.  Genitourinary: Positive for dysuria, hematuria, flank pain, difficulty urinating and testicular pain.  Musculoskeletal: Positive for back pain.  Skin: Negative for rash.  Neurological: Positive for headaches.  Hematological: Does not bruise/bleed easily.  Psychiatric/Behavioral: Negative for confusion.      Allergies  Penicillins and Morphine and related  Home Medications   Prior to Admission medications   Medication  Sig Start Date End Date Taking? Authorizing Provider  HYDROmorphone (DILAUDID) 4 MG tablet Take 1 tablet (4 mg total) by mouth every 6 (six) hours as needed for moderate pain or severe pain. 04/28/14  Yes Irine Seal, MD  phenazopyridine (PYRIDIUM) 200 MG tablet Take 1 tablet (200 mg total) by mouth 3 (three) times daily as needed for pain.  04/28/14  Yes Irine Seal, MD  tamsulosin (FLOMAX) 0.4 MG CAPS capsule Take 1 capsule (0.4 mg total) by mouth daily. 04/25/14  Yes Carmin Muskrat, MD  HYDROmorphone (DILAUDID) 4 MG tablet Take 1 tablet (4 mg total) by mouth every 6 (six) hours as needed for severe pain. 05/07/14   Larry Sorrow, MD   BP 161/109 mmHg  Pulse 72  Temp(Src) 97.8 F (36.6 C) (Oral)  Resp 20  Ht 5\' 10"  (1.778 m)  Wt 195 lb (88.451 kg)  BMI 27.98 kg/m2  SpO2 98% Physical Exam  Constitutional: He is oriented to person, place, and time. He appears well-developed and well-nourished.  HENT:  Head: Normocephalic and atraumatic.  Eyes: Conjunctivae and EOM are normal. Pupils are equal, round, and reactive to light.  Sclera clear  Neck: Normal range of motion. Neck supple.  Cardiovascular: Normal rate, regular rhythm and normal heart sounds.   No murmur heard. Pulmonary/Chest: Effort normal and breath sounds normal. No respiratory distress. He has no wheezes. He has no rales.  Lungs clear bilaterally  Abdominal: Soft. Bowel sounds are normal. There is tenderness.  Tender over left abdomen and left flank  Musculoskeletal: Normal range of motion. He exhibits no edema.  No swelling in ankles  Neurological: He is alert and oriented to person, place, and time. No cranial nerve deficit. He exhibits normal muscle tone. Coordination normal.  Skin: Skin is warm and dry.  Psychiatric: He has a normal mood and affect. His behavior is normal.  Nursing note and vitals reviewed.   ED Course  Procedures (including critical care time)  DIAGNOSTIC STUDIES: Oxygen Saturation is 96% on room air, adequate by my interpretation.    COORDINATION OF CARE: 11:43 AM Discussed treatment plan with patient at beside, the patient agrees with the plan and has no further questions at this time.   Labs Review Labs Reviewed  BASIC METABOLIC PANEL - Abnormal; Notable for the following:    GFR calc non Af Amer 84 (*)    All other  components within normal limits  URINE CULTURE  CBC WITH DIFFERENTIAL/PLATELET  URINALYSIS, ROUTINE W REFLEX MICROSCOPIC   Results for orders placed or performed during the hospital encounter of 05/07/14  CBC with Differential/Platelet  Result Value Ref Range   WBC 9.0 4.0 - 10.5 K/uL   RBC 4.98 4.22 - 5.81 MIL/uL   Hemoglobin 16.5 13.0 - 17.0 g/dL   HCT 46.6 39.0 - 52.0 %   MCV 93.6 78.0 - 100.0 fL   MCH 33.1 26.0 - 34.0 pg   MCHC 35.4 30.0 - 36.0 g/dL   RDW 12.1 11.5 - 15.5 %   Platelets 246 150 - 400 K/uL   Neutrophils Relative % 64 43 - 77 %   Neutro Abs 5.7 1.7 - 7.7 K/uL   Lymphocytes Relative 28 12 - 46 %   Lymphs Abs 2.5 0.7 - 4.0 K/uL   Monocytes Relative 6 3 - 12 %   Monocytes Absolute 0.6 0.1 - 1.0 K/uL   Eosinophils Relative 2 0 - 5 %   Eosinophils Absolute 0.2 0.0 - 0.7 K/uL   Basophils Relative  0 0 - 1 %   Basophils Absolute 0.0 0.0 - 0.1 K/uL  Basic metabolic panel  Result Value Ref Range   Sodium 140 135 - 145 mmol/L   Potassium 3.8 3.5 - 5.1 mmol/L   Chloride 108 96 - 112 mmol/L   CO2 24 19 - 32 mmol/L   Glucose, Bld 95 70 - 99 mg/dL   BUN 13 6 - 23 mg/dL   Creatinine, Ser 1.08 0.50 - 1.35 mg/dL   Calcium 8.5 8.4 - 10.5 mg/dL   GFR calc non Af Amer 84 (L) >90 mL/min   GFR calc Af Amer >90 >90 mL/min   Anion gap 8 5 - 15     Imaging Review Ct Renal Stone Study  05/07/2014   CLINICAL DATA:  Patient with history of renal stones. Left flank pain.  EXAM: CT ABDOMEN AND PELVIS WITHOUT CONTRAST  TECHNIQUE: Multidetector CT imaging of the abdomen and pelvis was performed following the standard protocol without IV contrast.  COMPARISON:  CT 04/21/2014  FINDINGS: Lower chest: Dependent atelectasis within the bilateral lower lobes. Normal heart size.  Hepatobiliary: Fatty deposition adjacent to the falciform ligament. Liver is normal in size and contour. Gallbladder is unremarkable. No intrahepatic or extrahepatic biliary ductal dilatation. Liver is diffusely low in  attenuation.  Pancreas: Unremarkable  Spleen: Unremarkable  Adrenals/Urinary Tract: Normal adrenal glands. Kidneys are symmetric in size. There is a left-sided double-J nephro ureteral stent in place. Within the proximal left ureter there is are residual 2-3 mm calculi (image 40; series 2) adjacent to the stent. Small amount of stranding about the left ureter throughout its course. Re- demonstrated 8 mm nonobstructing stone superior pole right kidney. Re- demonstrated 3 mm nonobstructing stone inferior pole left kidney.  Stomach/Bowel: Postsurgical changes about the cecum. No abnormal bowel wall thickening or evidence for bowel obstruction. No free fluid or free intraperitoneal air.  Vascular/Lymphatic: Normal caliber abdominal aorta. No retroperitoneal lymphadenopathy.  Other: Prostate unremarkable. Postsurgical changes compatible with prior right orchiectomy.  Musculoskeletal: No aggressive or acute appearing osseous lesions.  IMPRESSION: Interval insertion of left sided double-J nephro ureteral stent. There are residual ureteral calculi within the proximal left ureter adjacent to the stent measuring 2-3 mm. Mild stranding and fluid about the course of the left ureter likely secondary to recent intervention.  Bilateral nephrolithiasis.  Hepatic steatosis.   Electronically Signed   By: Lovey Newcomer M.D.   On: 05/07/2014 13:24     EKG Interpretation None      MDM   Final diagnoses:  Flank pain   Patient with persistent left flank pain following the placement of the stent. Is worried that something is wrong. Urologist referred him here. CT scan shows no abnormalities with ureteral stent. Renal function is normal. Patient state and is not being much however bladder is not full of urine and there is no evidence of any significant obstruction. Patient does not show evidence of urinary retention based on bladder scan. Patient will be discharged with pain control and follow-up with [facility]. Had long  discussion with patient that he'll need to get his pain control medications from [facility] in the future.  I personally performed the services described in this documentation, which was scribed in my presence. The recorded information has been reviewed and is accurate.     Larry Sorrow, MD 05/07/14 1348

## 2014-05-07 NOTE — Discharge Instructions (Signed)
Workup today for the flank pain shows no abnormalities with the left ureteral stent. Renal functions normal. Bladder scan showed no significant accumulation of urine in the bladder. Follow-up with [facility]. The lauded prescription renewed but we probably will be able to do this again.

## 2014-05-08 ENCOUNTER — Other Ambulatory Visit: Payer: Self-pay | Admitting: Urology

## 2014-05-09 ENCOUNTER — Encounter (HOSPITAL_COMMUNITY)
Admission: RE | Admit: 2014-05-09 | Discharge: 2014-05-09 | Disposition: A | Discharge status: Home / Self Care | Payer: Self-pay | Source: Ambulatory Visit | Attending: Urology | Admitting: Urology

## 2014-05-09 ENCOUNTER — Encounter (HOSPITAL_COMMUNITY): Payer: Self-pay

## 2014-05-10 ENCOUNTER — Inpatient Hospital Stay (HOSPITAL_COMMUNITY): Admission: RE | Admit: 2014-05-10 | Payer: Self-pay | Source: Ambulatory Visit

## 2014-05-12 ENCOUNTER — Ambulatory Visit (HOSPITAL_COMMUNITY): Payer: Self-pay | Admitting: Anesthesiology

## 2014-05-12 ENCOUNTER — Encounter (HOSPITAL_COMMUNITY): Payer: Self-pay

## 2014-05-12 ENCOUNTER — Ambulatory Visit (HOSPITAL_COMMUNITY)
Admission: RE | Admit: 2014-05-12 | Discharge: 2014-05-12 | Disposition: A | Discharge status: Home / Self Care | Payer: Self-pay | Source: Ambulatory Visit | Attending: Urology | Admitting: Urology

## 2014-05-12 ENCOUNTER — Ambulatory Visit (HOSPITAL_COMMUNITY): Payer: MEDICAID | Admitting: Anesthesiology

## 2014-05-12 ENCOUNTER — Encounter (HOSPITAL_COMMUNITY)
Admission: RE | Disposition: A | Discharge status: Home / Self Care | Payer: Self-pay | Source: Ambulatory Visit | Attending: Urology

## 2014-05-12 ENCOUNTER — Ambulatory Visit (HOSPITAL_COMMUNITY): Payer: Self-pay

## 2014-05-12 ENCOUNTER — Other Ambulatory Visit: Payer: Self-pay

## 2014-05-12 DIAGNOSIS — F419 Anxiety disorder, unspecified: Secondary | ICD-10-CM | POA: Insufficient documentation

## 2014-05-12 DIAGNOSIS — G4730 Sleep apnea, unspecified: Secondary | ICD-10-CM | POA: Insufficient documentation

## 2014-05-12 DIAGNOSIS — Z880 Allergy status to penicillin: Secondary | ICD-10-CM | POA: Insufficient documentation

## 2014-05-12 DIAGNOSIS — F329 Major depressive disorder, single episode, unspecified: Secondary | ICD-10-CM | POA: Insufficient documentation

## 2014-05-12 DIAGNOSIS — K219 Gastro-esophageal reflux disease without esophagitis: Secondary | ICD-10-CM | POA: Insufficient documentation

## 2014-05-12 DIAGNOSIS — Z9049 Acquired absence of other specified parts of digestive tract: Secondary | ICD-10-CM | POA: Insufficient documentation

## 2014-05-12 DIAGNOSIS — Z8547 Personal history of malignant neoplasm of testis: Secondary | ICD-10-CM | POA: Insufficient documentation

## 2014-05-12 DIAGNOSIS — I10 Essential (primary) hypertension: Secondary | ICD-10-CM | POA: Insufficient documentation

## 2014-05-12 DIAGNOSIS — G43909 Migraine, unspecified, not intractable, without status migrainosus: Secondary | ICD-10-CM | POA: Insufficient documentation

## 2014-05-12 DIAGNOSIS — N201 Calculus of ureter: Secondary | ICD-10-CM | POA: Insufficient documentation

## 2014-05-12 DIAGNOSIS — Z886 Allergy status to analgesic agent status: Secondary | ICD-10-CM | POA: Insufficient documentation

## 2014-05-12 HISTORY — PX: CYSTOSCOPY W/ URETERAL STENT REMOVAL: SHX1430

## 2014-05-12 HISTORY — PX: CYSTOSCOPY WITH URETEROSCOPY: SHX5123

## 2014-05-12 SURGERY — REMOVAL, STENT, URETER, CYSTOSCOPIC
Anesthesia: General | Laterality: Left

## 2014-05-12 MED ORDER — HYDROMORPHONE HCL 4 MG PO TABS: 4.0000 mg | ORAL_TABLET | Freq: Four times a day (QID) | ORAL | Status: DC | PRN

## 2014-05-12 MED ORDER — ONDANSETRON HCL 4 MG/2ML IJ SOLN: 4.0000 mg | Freq: Once | INTRAMUSCULAR | Status: DC | PRN

## 2014-05-12 MED ORDER — PROPOFOL 10 MG/ML IV BOLUS
INTRAVENOUS | Status: AC
  Filled 2014-05-12: qty 20

## 2014-05-12 MED ORDER — LACTATED RINGERS IV SOLN
INTRAVENOUS | Status: DC | PRN
  Administered 2014-05-12: 07:00:00 via INTRAVENOUS

## 2014-05-12 MED ORDER — ONDANSETRON HCL 4 MG/2ML IJ SOLN
4.0000 mg | Freq: Once | INTRAMUSCULAR | Status: AC
  Administered 2014-05-12: 4 mg via INTRAVENOUS

## 2014-05-12 MED ORDER — LACTATED RINGERS IV SOLN
INTRAVENOUS | Status: DC
  Administered 2014-05-12: 07:00:00 via INTRAVENOUS

## 2014-05-12 MED ORDER — FENTANYL CITRATE 0.05 MG/ML IJ SOLN
25.0000 ug | INTRAMUSCULAR | Status: DC | PRN
  Administered 2014-05-12 (×3): 50 ug via INTRAVENOUS
  Filled 2014-05-12 (×2): qty 2

## 2014-05-12 MED ORDER — SUCCINYLCHOLINE CHLORIDE 20 MG/ML IJ SOLN
INTRAMUSCULAR | Status: DC | PRN
  Administered 2014-05-12: 120 mg via INTRAVENOUS

## 2014-05-12 MED ORDER — FENTANYL CITRATE 0.05 MG/ML IJ SOLN
INTRAMUSCULAR | Status: DC | PRN
  Administered 2014-05-12 (×3): 50 ug via INTRAVENOUS

## 2014-05-12 MED ORDER — FENTANYL CITRATE 0.05 MG/ML IJ SOLN
INTRAMUSCULAR | Status: AC
  Filled 2014-05-12: qty 5

## 2014-05-12 MED ORDER — MIDAZOLAM HCL 2 MG/2ML IJ SOLN
1.0000 mg | INTRAMUSCULAR | Status: DC | PRN
  Administered 2014-05-12: 2 mg via INTRAVENOUS

## 2014-05-12 MED ORDER — NALOXONE HCL 0.4 MG/ML IJ SOLN
INTRAMUSCULAR | Status: DC | PRN
  Administered 2014-05-12 (×2): 40 ug via INTRAVENOUS
  Administered 2014-05-12: 20 ug via INTRAVENOUS

## 2014-05-12 MED ORDER — MIDAZOLAM HCL 2 MG/2ML IJ SOLN
INTRAMUSCULAR | Status: AC
  Filled 2014-05-12: qty 2

## 2014-05-12 MED ORDER — CIPROFLOXACIN IN D5W 400 MG/200ML IV SOLN
400.0000 mg | INTRAVENOUS | Status: AC
  Administered 2014-05-12: 400 mg via INTRAVENOUS
  Filled 2014-05-12: qty 200

## 2014-05-12 MED ORDER — LIDOCAINE HCL 2 % EX GEL
CUTANEOUS | Status: DC | PRN
  Administered 2014-05-12: 1

## 2014-05-12 MED ORDER — SODIUM CHLORIDE 0.9 % IJ SOLN
INTRAMUSCULAR | Status: AC
  Filled 2014-05-12: qty 10

## 2014-05-12 MED ORDER — IOHEXOL 350 MG/ML SOLN
INTRAVENOUS | Status: DC | PRN
  Administered 2014-05-12: 50 mL

## 2014-05-12 MED ORDER — STERILE WATER FOR IRRIGATION IR SOLN
Status: DC | PRN
  Administered 2014-05-12: 1000 mL

## 2014-05-12 MED ORDER — SUCCINYLCHOLINE CHLORIDE 20 MG/ML IJ SOLN
INTRAMUSCULAR | Status: AC
  Filled 2014-05-12: qty 1

## 2014-05-12 MED ORDER — PROPOFOL 10 MG/ML IV BOLUS
INTRAVENOUS | Status: DC | PRN
  Administered 2014-05-12: 20 mg via INTRAVENOUS
  Administered 2014-05-12: 150 mg via INTRAVENOUS

## 2014-05-12 MED ORDER — LIDOCAINE HCL 2 % EX GEL
CUTANEOUS | Status: AC
  Filled 2014-05-12: qty 10

## 2014-05-12 MED ORDER — ONDANSETRON HCL 4 MG/2ML IJ SOLN
INTRAMUSCULAR | Status: AC
  Filled 2014-05-12: qty 2

## 2014-05-12 MED ORDER — ARTIFICIAL TEARS OP OINT
TOPICAL_OINTMENT | OPHTHALMIC | Status: AC
  Filled 2014-05-12: qty 7

## 2014-05-12 MED ORDER — LIDOCAINE HCL (CARDIAC) 20 MG/ML IV SOLN
INTRAVENOUS | Status: DC | PRN
  Administered 2014-05-12: 50 mg via INTRAVENOUS

## 2014-05-12 MED ORDER — SODIUM CHLORIDE 0.9 % IR SOLN
Status: DC | PRN
  Administered 2014-05-12 (×2): 3000 mL

## 2014-05-12 MED ORDER — LIDOCAINE HCL (PF) 1 % IJ SOLN
INTRAMUSCULAR | Status: AC
  Filled 2014-05-12: qty 5

## 2014-05-12 MED ORDER — EPHEDRINE SULFATE 50 MG/ML IJ SOLN
INTRAMUSCULAR | Status: AC
  Filled 2014-05-12: qty 1

## 2014-05-12 MED ORDER — NALOXONE HCL 0.4 MG/ML IJ SOLN
INTRAMUSCULAR | Status: AC
  Filled 2014-05-12: qty 1

## 2014-05-12 MED ORDER — ARTIFICIAL TEARS OP OINT
TOPICAL_OINTMENT | OPHTHALMIC | Status: DC | PRN
  Administered 2014-05-12: 1 via OPHTHALMIC

## 2014-05-12 SURGICAL SUPPLY — 31 items
BAG DRAIN URO TABLE W/ADPT NS (DRAPE) ×4 IMPLANT
BAG DRN 8 ADPR NS SKTRN CSTL (DRAPE) ×2
BAG HAMPER (MISCELLANEOUS) ×4 IMPLANT
BASKET STONE 1.9X4X120X20 (MISCELLANEOUS) ×3 IMPLANT
CATH OPEN TIP 5FR (CATHETERS) IMPLANT
CATH URET 5FR 28IN OPEN ENDED (CATHETERS) ×3 IMPLANT
CLOTH BEACON ORANGE TIMEOUT ST (SAFETY) ×8 IMPLANT
GLOVE BIOGEL PI IND STRL 7.0 (GLOVE) ×2 IMPLANT
GLOVE BIOGEL PI INDICATOR 7.0 (GLOVE) ×4
GLOVE SS BIOGEL STRL SZ 6.5 (GLOVE) ×1 IMPLANT
GLOVE SUPERSENSE BIOGEL SZ 6.5 (GLOVE) ×2
GLOVE SURG SS PI 8.0 STRL IVOR (GLOVE) ×4 IMPLANT
GOWN STRL REUS W/TWL LRG LVL3 (GOWN DISPOSABLE) ×4 IMPLANT
GUIDEWIRE STR DUAL SENSOR (WIRE) ×3 IMPLANT
IV NS IRRIG 3000ML ARTHROMATIC (IV SOLUTION) ×7 IMPLANT
KIT ROOM TURNOVER AP CYSTO (KITS) ×4 IMPLANT
LASER FIBER DISP (UROLOGICAL SUPPLIES) IMPLANT
LASER FIBER DISP 1000U (UROLOGICAL SUPPLIES) IMPLANT
MANIFOLD NEPTUNE II (INSTRUMENTS) ×4 IMPLANT
PACK CYSTO (CUSTOM PROCEDURE TRAY) ×4 IMPLANT
PAD ARMBOARD 7.5X6 YLW CONV (MISCELLANEOUS) ×4 IMPLANT
PAD TELFA 3X4 1S STER (GAUZE/BANDAGES/DRESSINGS) ×1 IMPLANT
SHEATH URET ACCESS 12FR/35CM (UROLOGICAL SUPPLIES) ×3 IMPLANT
SHEATH URET ACCESS 12FR/55CM (UROLOGICAL SUPPLIES) IMPLANT
SPONGE GAUZE 4X4 12PLY (GAUZE/BANDAGES/DRESSINGS) ×3 IMPLANT
STENT URET 6FRX26 CONTOUR (STENTS) ×3 IMPLANT
SYRINGE IRR TOOMEY STRL 70CC (SYRINGE) IMPLANT
TAPE HY 2X5 PINK NS (GAUZE/BANDAGES/DRESSINGS) ×3 IMPLANT
TOWEL OR 17X26 4PK STRL BLUE (TOWEL DISPOSABLE) ×4 IMPLANT
WATER STERILE IRR 1000ML POUR (IV SOLUTION) ×4 IMPLANT
WATER STERILE IRR 3000ML UROMA (IV SOLUTION) ×4 IMPLANT

## 2014-05-12 NOTE — Anesthesia Procedure Notes (Signed)
Procedure Name: Intubation Date/Time: 05/12/2014 7:37 AM Performed by: Andree Elk, AMY A Pre-anesthesia Checklist: Patient identified, Patient being monitored, Timeout performed, Emergency Drugs available and Suction available Patient Re-evaluated:Patient Re-evaluated prior to inductionOxygen Delivery Method: Circle System Utilized Preoxygenation: Pre-oxygenation with 100% oxygen Intubation Type: IV induction, Rapid sequence and Cricoid Pressure applied Ventilation: Mask ventilation without difficulty Laryngoscope Size: Miller and 3 Grade View: Grade I Tube type: Oral Tube size: 7.0 mm Number of attempts: 1 Airway Equipment and Method: Stylet Placement Confirmation: ETT inserted through vocal cords under direct vision,  positive ETCO2 and breath sounds checked- equal and bilateral Secured at: 21 cm Tube secured with: Tape Dental Injury: Teeth and Oropharynx as per pre-operative assessment

## 2014-05-12 NOTE — Anesthesia Postprocedure Evaluation (Signed)
  Anesthesia Post-op Note  Patient: Larry Hoover  Procedure(s) Performed: Procedure(s): CYSTOSCOPY WITH LEFT URETERAL STENT REMOVAL (Left) LEFT URETEROSCOPY WITH STONE EXTRACTION (N/A) LEFT URETERAL STENT PLACEMENT (N/A)  Patient Location: PACU  Anesthesia Type:General  Level of Consciousness: awake, alert , oriented and patient cooperative  Airway and Oxygen Therapy: Patient Spontanous Breathing  Post-op Pain: mild  Post-op Assessment: Post-op Vital signs reviewed, Patient's Cardiovascular Status Stable, Respiratory Function Stable, Patent Airway, No signs of Nausea or vomiting and Pain level controlled  Post-op Vital Signs: Reviewed and stable  Last Vitals:  Filed Vitals:   05/12/14 0900  BP: 127/86  Pulse: 69  Temp:   Resp: 14    Complications: No apparent anesthesia complications

## 2014-05-12 NOTE — Interval H&P Note (Signed)
History and Physical Interval Note:  He is having a lot of stent pain and had a 62mm stone on CT adjacent to his stent.   I have decided to removed the stent in the OR and look up to get the stone out.   05/12/2014 7:20 AM  Larry Hoover  has presented today for surgery, with the diagnosis of left ureteral stent and stones  The various methods of treatment have been discussed with the patient and family. After consideration of risks, benefits and other options for treatment, the patient has consented to  Procedure(s): CYSTOSCOPY WITH STENT REMOVAL (Left) CYSTOSCOPY WITH URETEROSCOPY/HOLMIUM LASER WITH STONE EXTRACTION (N/A) HOLMIUM LASER APPLICATION (N/A) as a surgical intervention .  The patient's history has been reviewed, patient examined, no change in status, stable for surgery.  I have reviewed the patient's chart and labs.  Questions were answered to the patient's satisfaction.     [name]

## 2014-05-12 NOTE — H&P (View-Only) (Signed)
Active Problems  1. Calculus of left ureter (N20.1)  History of Present Illness   Larry Hoover is a 44 yo WM who had the onset a week ago of severe left flank pain.  He was seen in the ER and a CT showed a 6x7mm left proximal stone.  He has had continued pain with nausea and vomiting and he has not had a BM in a week.  He has no voiding complaints.  He has no hematuria.  He has passed a couple of stones remotely.  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   Past Medical History  1. History of Anxiety (F41.9)  2. History of depression (Z86.59)  3. History of esophageal reflux (Z87.19)  4. History of hypertension (Z86.79)  5. History of malignant neoplasm of testis (Z85.47)  6. History of migraine headaches (Z86.69)  7. History of renal calculi (Z87.442)  8. History of sleep apnea (Z87.09)  9. History of Seizures (R56.9)  Surgical History  1. History of Appendectomy  2. History of Surgery Testis  Current Meds  1. Flomax 0.4 MG Oral Capsule;  Therapy: (Recorded:18Mar2016) to Recorded  2. Percocet 10-325 MG Oral Tablet;  Therapy: (Recorded:18Mar2016) to Recorded  Allergies  1. Morphine Derivatives  2. Penicillins  Family History  1. Family history of Coronary artery disease  2. Family history of stroke (Z82.3)  Social History   Caffeine use (F15.90)   Divorced   Denied: History of Tobacco use  Review of Systems Genitourinary, constitutional, skin, eye, otolaryngeal, hematologic/lymphatic, cardiovascular, pulmonary, endocrine, musculoskeletal, gastrointestinal, neurological and psychiatric system(s) were reviewed and pertinent findings if present are noted and are otherwise negative.  Genitourinary: nocturia, difficulty starting the urinary stream, weak urinary stream and urinary stream starts and stops.  Gastrointestinal: nausea, vomiting, heartburn and constipation.  Constitutional: night sweats and feeling tired (fatigue).   Integumentary: pruritus.  Eyes: blurred vision.  ENT: sinus problems.  Hematologic/Lymphatic: a tendency to easily bruise.  Cardiovascular: chest pain.  Respiratory: shortness of breath.  Endocrine: polydipsia.  Musculoskeletal: back pain and joint pain.  Neurological: headache and dizziness.  Psychiatric: anxiety and depression.    Vitals Vital Signs [Data Includes: Last 1 Day]  Recorded: [id number] 08:18AM  Height: 5 ft 9 in Weight: 203 lb  BMI Calculated: 29.98 BSA Calculated: 2.08 Blood Pressure: 142 / 99 Temperature: 97.6 F Heart Rate: 88  Physical Exam Constitutional: Well nourished and well developed . In acute distress.   ENT:. The ears and nose are normal in appearance.   Neck: The appearance of the neck is normal and no neck mass is present.   Pulmonary: No respiratory distress and normal respiratory rhythm and effort.   Cardiovascular: Heart rate and rhythm are normal . No peripheral edema.   Abdomen: The abdomen is flat. No masses are palpated. Severe tenderness in the LLQ is present. severe left CVA tenderness. No hernias are palpable. No hepatosplenomegaly noted.   Lymphatics: The posterior cervical and supraclavicular nodes are not enlarged or tender.   Skin: Normal skin turgor, no visible rash and no visible skin lesions.   Neuro/Psych:. Mood and affect are appropriate.    Results/Data Urine [Data Includes: Last 1 Day]   [id number]  COLOR YELLOW   APPEARANCE CLEAR   SPECIFIC GRAVITY 1.020   pH 6.0   GLUCOSE NEG mg/dL  BILIRUBIN NEG   KETONE NEG mg/dL  BLOOD SMALL   PROTEIN NEG mg/dL  UROBILINOGEN 0.2 mg/dL  NITRITE NEG  LEUKOCYTE ESTERASE NEG   SQUAMOUS EPITHELIAL/HPF NONE SEEN   WBC 0-2 WBC/hpf  RBC 0-2 RBC/hpf  BACTERIA RARE   CRYSTALS NONE SEEN   CASTS NONE SEEN    Old records or history reviewed: I have reviewed his hospital records.  The following images/tracing/specimen were independently visualized:  CT films reviewed.  The  following clinical lab reports were reviewed:  UA reviewed.    Assessment  1. Calculus of left ureter (N20.1)   He has a 6x7 left proximal stone with persistent pain.   Plan Nephrolithiasis   1. UA With REFLEX; [Do Not Release]; Status:Hold For - [employer];  Requested for:18Mar2016;    I am going to get him set up to go to the [facility] OR later today for cystoscopy with possible left ureteroscopic stone extraction with holmium lasertripsy and left ureteral stenting.  I reviewed the risks of bleeding, infection, ureteral injury, need for a stent or secondary procedures, thrombotic events and anesthetic risks.   Signatures Electronically signed by : Irine Seal, M.D.; Apr 28 2014  9:19AM EST

## 2014-05-12 NOTE — Transfer of Care (Signed)
Immediate Anesthesia Transfer of Care Note  Patient: Larry Hoover  Procedure(s) Performed: Procedure(s): CYSTOSCOPY WITH LEFT URETERAL STENT REMOVAL (Left) LEFT URETEROSCOPY WITH STONE EXTRACTION (N/A) LEFT URETERAL STENT PLACEMENT (N/A)  Patient Location: PACU  Anesthesia Type:General  Level of Consciousness: awake, alert , oriented and patient cooperative  Airway & Oxygen Therapy: Patient Spontanous Breathing and Patient connected to face mask oxygen  Post-op Assessment: Report given to RN and Post -op Vital signs reviewed and stable  Post vital signs: Reviewed and stable  Last Vitals:  Filed Vitals:   05/12/14 0849  BP: 137/92  Pulse:   Temp:   Resp: 11    Complications: No apparent anesthesia complications

## 2014-05-12 NOTE — Brief Op Note (Signed)
05/12/2014  8:13 AM  PATIENT:  Larry Hoover  44 y.o. male  PRE-OPERATIVE DIAGNOSIS:  left ureteral stent and stones  POST-OPERATIVE DIAGNOSIS:  left ureteral stent and stones  PROCEDURE:  Procedure(s): CYSTOSCOPY WITH LEFT URETERAL STENT REMOVAL (Left) LEFT URETEROSCOPY WITH STONE EXTRACTION (N/A)  LEFT URETERAL STENT PLACEMENT  SURGEON:  Surgeon(s) and Role:    * Irine Seal, MD - Primary  PHYSICIAN ASSISTANT:   ASSISTANTS: none   ANESTHESIA:   general  EBL:  Total I/O In: 500 [I.V.:500] Out: 0   BLOOD ADMINISTERED:none  DRAINS: Left 6 x 26 JJ stent with string   LOCAL MEDICATIONS USED:  NONE  SPECIMEN:  Source of Specimen:  left ureteral stone  DISPOSITION OF SPECIMEN:  PATHOLOGY  COUNTS:  YES  TOURNIQUET:  * No tourniquets in log *  DICTATION: .Other Dictation: Dictation Number [id number]  PLAN OF CARE: Discharge to home after PACU  PATIENT DISPOSITION:  PACU - hemodynamically stable.   Delay start of Pharmacological VTE agent (>24hrs) due to surgical blood loss or risk of bleeding: not applicable

## 2014-05-12 NOTE — Anesthesia Preprocedure Evaluation (Signed)
Anesthesia Evaluation  Patient identified by MRN, date of birth, ID band Patient awake    Reviewed: Allergy & Precautions, NPO status , Patient's Chart, lab work & pertinent test results  Airway Mallampati: I  TM Distance: >3 FB     Dental  (+) Poor Dentition, Missing, Loose, Dental Advisory Given,    Pulmonary sleep apnea , former smoker,  breath sounds clear to auscultation        Cardiovascular hypertension, Rhythm:Regular Rate:Normal     Neuro/Psych Seizures -, Poorly Controlled,  PSYCHIATRIC DISORDERS Depression    GI/Hepatic PUD, GERD-  Medicated and Controlled,(+)     substance abuse  marijuana use,   Endo/Other    Renal/GU      Musculoskeletal   Abdominal   Peds  Hematology   Anesthesia Other Findings Chronic narcotic + benzodiazapine    Reproductive/Obstetrics                             Anesthesia Physical Anesthesia Plan  ASA: III  Anesthesia Plan: General   Post-op Pain Management:    Induction: Intravenous, Rapid sequence and Cricoid pressure planned  Airway Management Planned: Oral ETT  Additional Equipment:   Intra-op Plan:   Post-operative Plan: Extubation in OR  Informed Consent: I have reviewed the patients History and Physical, chart, labs and discussed the procedure including the risks, benefits and alternatives for the proposed anesthesia with the patient or authorized representative who has indicated his/her understanding and acceptance.     Plan Discussed with:   Anesthesia Plan Comments:         Anesthesia Quick Evaluation

## 2014-05-12 NOTE — Discharge Instructions (Addendum)
Cystoscopy, Care After Refer to this sheet in the next few weeks. These instructions provide you with information on caring for yourself after your procedure. Your caregiver may also give you more specific instructions. Your treatment has been planned according to current medical practices, but problems sometimes occur. Call your caregiver if you have any problems or questions after your procedure. HOME CARE INSTRUCTIONS  Things you can do to ease any discomfort after your procedure include:  Drinking enough water and fluids to keep your urine clear or pale yellow.  Taking a warm bath to relieve any burning feelings. SEEK IMMEDIATE MEDICAL CARE IF:   You have an increase in blood in your urine.  You notice blood clots in your urine.  You have difficulty passing urine.  You have the chills.  You have abdominal pain.  You have a fever or persistent symptoms for more than 2-3 days.  You have a fever and your symptoms suddenly get worse. MAKE SURE YOU:   Understand these instructions.  Will watch your condition.  Will get help right away if you are not doing well or get worse. Document Released: 08/16/2004 Document Revised: 09/29/2012 Document Reviewed: 07/21/2011 [facility] Patient Information 2015 [place], [place]. This information is not intended to replace advice given to you by your health care provider. Make sure you discuss any questions you have with your health care provider.   Ureteral Stent Implantation Ureteral stent implantation is the implantation of a soft plastic tube with multiple holes into the tube that drains urine from your kidney to your bladder (ureter). The stent helps drain your kidney when there is a blockage of the flow of urine in your ureter. The stent has a coil on each end to keep it from falling out. One end stays in the kidney. The other end stays in the bladder. It is most often taken out after any blockage has been removed or your ureter has healed.  Short-term stents have a string attached to make removal quite easy. Removal of a short-term stent can be done in your health care provider's office or by you at home. Long-term stents need to be changed every few months. LET [facility] CARE PROVIDER KNOW ABOUT:  Any allergies you have.  All medicines you are taking, including vitamins, herbs, eye drops, creams, and over-the-counter medicines.  Previous problems you or members of your family have had with the use of anesthetics.  Any blood disorders you have.  Previous surgeries you have had.  Medical conditions you have. RISKS AND COMPLICATIONS Generally, ureteral stent implantation is a safe procedure. However, as with any procedure, complications can occur. Possible complications include:  Movement of the stent away from where it was originally placed (migration). This may affect the ability of the stent to properly drain your kidney. If migration of the stent occurs, the stent may need to be replaced or repositioned.  Perforation of the ureter.  Infection. BEFORE THE PROCEDURE  You may be asked to wash your genital area with sterile soap the morning of your procedure.  You may be given an oral antibiotic which you should take with a sip of water as prescribed by your health care provider.  You may be asked to not eat or drink for 8 hours before the surgery. PROCEDURE  First you will be given an anesthetic so you do not feel pain during the procedure.  Your health care provider will insert a special lighted instrument called a cystoscope into your bladder. This allows  your health care provider to see the opening to your ureter.  A thin wire is carefully threaded into your bladder and up the ureter. The stent is inserted over the wire and the wire is then removed.  Your bladder will be emptied of urine. AFTER THE PROCEDURE You will be taken to a recovery room until it is okay for you to go home. Document Released:  01/25/2000 Document Revised: 02/01/2013 Document Reviewed: 07/06/2012 [facility] Patient Information 2015 [place], [place]. This information is not intended to replace advice given to you by your health care provider. Make sure you discuss any questions you have with your health care provider.

## 2014-05-12 NOTE — Op Note (Signed)
NAMEKORTEZ, MURTAGH NO.:  [id number]  MEDICAL RECORD NO.:  [id number]  LOCATION:  [facility]                          FACILITY:  [facility]  PHYSICIAN:  Marshall Cork. Jeffie Pollock, M.D.    DATE OF BIRTH:  05-Apr-1970  DATE OF PROCEDURE:  05/12/2014 DATE OF DISCHARGE:                              OPERATIVE REPORT   PROCEDURES: 1. Cystoscopy with removal of left double-J stent. 2. Left ureteroscopic stone extraction. 3. Insertion of left double-J stent.  PREOPERATIVE DIAGNOSIS:  Left ureteral stones and retained stent.  POSTOPERATIVE DIAGNOSIS:  Left ureteral stones and retained stent.  SURGEON:  Marshall Cork. Jeffie Pollock, M.D.  ANESTHESIA:  General.  SPECIMEN:  Stone fragments.  DRAINS:  A 6-French x 26-cm double-J stent.  BLOOD LOSS:  None.  COMPLICATIONS:  None.  INDICATIONS:  Mr. Sjogren is a 44 year old white male with a history of a large left proximal stone.  He had previously undergone laser and stenting, and wants to have the stent removed next week in the office, but he was seen in the emergency room for recurrent pain and was found to have residual fragments along the stent and was felt that ureteroscopy and possible stent removal were indicated.  FINDINGS OF PROCEDURE:  He was given Cipro.  He was taken to the operating room where general anesthetic was induced.  He was fitted with PAS hose and was placed in lithotomy position.  His perineum and genitalia were prepped with Betadine solution and he was draped in usual sterile fashion.  Cystoscopy was performed using the 23-French scope with a 30-degree lens.  Examination revealed a normal urethra.  The external sphincter was intact.  The prostate was short without obstruction.  Examination of bladder revealed mild trabeculation.  The urine was Pyridium stain.  The right ureteral orifice was unremarkable.  The left ureteral orifice was edematous with a stent exiting the meatus.  The stent was grasped with grasping forceps  and pulled the urethral meatus and a wire was passed to the kidney.  The stent was then removed. A 12/14 x 35 cm access sheath was passed over the wire initially.  The inner core was passed followed by the assembled sheath up to just below the stones that were easily seen on fluoroscopy in the proximal ureter.  The inner core and wire were then removed and a dual lumen Wolf flexible digital ureteroscope was then passed through the sheath to the level of the stone.  The stone fragments were then grasped with an escape basket and removed sequentially until none were remaining in the ureter.  The scope was then advanced to the kidney.  There were a couple of Randall's plaques with 1 adherent stone in the lower pole, but it was in the location that I could not grasp.  At this point, ureteroscope was removed.  Guidewire was reinserted to the kidney and the access sheath was removed.  The cystoscope was reinserted over the wire and a 6-French 26-cm double- J stent with string was inserted over the wire to the kidney.  The wire was removed leaving a good coil in the kidney and a good coil in the bladder.  The  bladder was drained and the cystoscope was removed leaving stent string exiting urethra.  The urethra was then instilled with 10 mL of 2% lidocaine jelly, which was secured with a gauze wrap.  The string was secured to the patient's penis with pink tape.  He was taken down from lithotomy position.  His anesthetic was reversed.  He was moved to recovery in stable condition.  There were no complications.     Marshall Cork. Jeffie Pollock, M.D.     JJW/MEDQ  D:  05/12/2014  T:  05/12/2014  Job:  [id number]

## 2014-05-15 ENCOUNTER — Encounter (HOSPITAL_COMMUNITY): Payer: Self-pay | Admitting: Urology

## 2014-05-15 LAB — STONE ANALYSIS: Stone Weight KSTONE: 0.046 g

## 2014-05-16 ENCOUNTER — Ambulatory Visit (INDEPENDENT_AMBULATORY_CARE_PROVIDER_SITE_OTHER): Payer: Self-pay | Admitting: Urology

## 2014-05-16 DIAGNOSIS — N201 Calculus of ureter: Secondary | ICD-10-CM

## 2014-05-25 ENCOUNTER — Other Ambulatory Visit: Payer: Self-pay | Admitting: Urology

## 2014-05-25 ENCOUNTER — Ambulatory Visit (HOSPITAL_COMMUNITY)
Admission: RE | Admit: 2014-05-25 | Discharge: 2014-05-25 | Disposition: A | Discharge status: Home / Self Care | Payer: Self-pay | Source: Ambulatory Visit | Attending: Urology | Admitting: Urology

## 2014-05-25 DIAGNOSIS — R1032 Left lower quadrant pain: Secondary | ICD-10-CM | POA: Insufficient documentation

## 2014-05-25 DIAGNOSIS — Z8547 Personal history of malignant neoplasm of testis: Secondary | ICD-10-CM | POA: Insufficient documentation

## 2014-05-25 DIAGNOSIS — N201 Calculus of ureter: Secondary | ICD-10-CM

## 2014-05-25 DIAGNOSIS — Z87442 Personal history of urinary calculi: Secondary | ICD-10-CM | POA: Insufficient documentation

## 2014-05-25 DIAGNOSIS — N200 Calculus of kidney: Secondary | ICD-10-CM | POA: Insufficient documentation

## 2014-05-26 ENCOUNTER — Ambulatory Visit (INDEPENDENT_AMBULATORY_CARE_PROVIDER_SITE_OTHER): Payer: Self-pay | Admitting: Urology

## 2014-05-26 DIAGNOSIS — R201 Hypoesthesia of skin: Secondary | ICD-10-CM

## 2014-05-26 DIAGNOSIS — R300 Dysuria: Secondary | ICD-10-CM

## 2015-03-31 ENCOUNTER — Emergency Department (HOSPITAL_COMMUNITY)
Admission: EM | Admit: 2015-03-31 | Discharge: 2015-03-31 | Disposition: A | Discharge status: Home / Self Care | Payer: MEDICAID | Attending: Emergency Medicine | Admitting: Emergency Medicine

## 2015-03-31 ENCOUNTER — Encounter (HOSPITAL_COMMUNITY): Payer: Self-pay | Admitting: Emergency Medicine

## 2015-03-31 ENCOUNTER — Emergency Department (HOSPITAL_COMMUNITY): Payer: MEDICAID

## 2015-03-31 DIAGNOSIS — Z8547 Personal history of malignant neoplasm of testis: Secondary | ICD-10-CM | POA: Insufficient documentation

## 2015-03-31 DIAGNOSIS — I10 Essential (primary) hypertension: Secondary | ICD-10-CM | POA: Insufficient documentation

## 2015-03-31 DIAGNOSIS — Z880 Allergy status to penicillin: Secondary | ICD-10-CM | POA: Insufficient documentation

## 2015-03-31 DIAGNOSIS — R0789 Other chest pain: Secondary | ICD-10-CM | POA: Insufficient documentation

## 2015-03-31 DIAGNOSIS — Z87891 Personal history of nicotine dependence: Secondary | ICD-10-CM | POA: Insufficient documentation

## 2015-03-31 DIAGNOSIS — Z7982 Long term (current) use of aspirin: Secondary | ICD-10-CM | POA: Insufficient documentation

## 2015-03-31 DIAGNOSIS — Z8719 Personal history of other diseases of the digestive system: Secondary | ICD-10-CM | POA: Insufficient documentation

## 2015-03-31 LAB — CBC
HCT: 47.1 % (ref 39.0–52.0)
Hemoglobin: 16.9 g/dL (ref 13.0–17.0)
MCH: 33.6 pg (ref 26.0–34.0)
MCHC: 35.9 g/dL (ref 30.0–36.0)
MCV: 93.6 fL (ref 78.0–100.0)
PLATELETS: 213 10*3/uL (ref 150–400)
RBC: 5.03 MIL/uL (ref 4.22–5.81)
RDW: 12.3 % (ref 11.5–15.5)
WBC: 8.8 10*3/uL (ref 4.0–10.5)

## 2015-03-31 LAB — BASIC METABOLIC PANEL
Anion gap: 8 (ref 5–15)
BUN: 12 mg/dL (ref 6–20)
CO2: 26 mmol/L (ref 22–32)
Calcium: 8.7 mg/dL — ABNORMAL LOW (ref 8.9–10.3)
Chloride: 104 mmol/L (ref 101–111)
Creatinine, Ser: 1.09 mg/dL (ref 0.61–1.24)
GFR calc non Af Amer: >60 mL/min (ref >60)
Glucose, Bld: 101 mg/dL — ABNORMAL HIGH (ref 65–99)
Potassium: 3.5 mmol/L (ref 3.5–5.1)
Sodium: 138 mmol/L (ref 135–145)

## 2015-03-31 LAB — TROPONIN I
Troponin I: <0.03 ng/mL (ref <0.031)
Troponin I: <0.03 ng/mL (ref <0.031)

## 2015-03-31 MED ORDER — HYDROCODONE-ACETAMINOPHEN 5-325 MG PO TABS
2.0000 | ORAL_TABLET | Freq: Once | ORAL | Status: AC
  Administered 2015-03-31: 2 via ORAL
  Filled 2015-03-31: qty 2

## 2015-03-31 MED ORDER — HYDROCODONE-ACETAMINOPHEN 5-325 MG PO TABS: 1.0000 | ORAL_TABLET | Freq: Four times a day (QID) | ORAL | Status: DC | PRN

## 2015-03-31 NOTE — ED Provider Notes (Signed)
CSN: [id number]     Arrival date & time 03/31/15  1617 History   First MD Initiated Contact with Patient 03/31/15 1626     Chief Complaint  Patient presents with  . Chest Pain     (Consider location/radiation/quality/duration/timing/severity/associated sxs/prior Treatment) Patient is a 45 y.o. male presenting with chest pain. The history is provided by the patient and the EMS personnel.  Chest Pain Associated symptoms: no abdominal pain, no back pain, no cough, no fever, no headache, no nausea, no palpitations, no shortness of breath and not vomiting   Patient c/o acute onset mid/midline chest pain today. States had lifted a washing machine earlier today by himself, no pain while lifting. States pain started later, at rest. Pain constant, dull, mod-severe. Worse w certain positional changes, movements. No other recent cp or discomfort w exertion. No unusual doe or fatigue. No associated nv, diaphoresis. No hx same pain. No hx cad. No fam hx cad. Denies cocaine or drug use.  Former smoker. Denies leg pain or swelling. No recent surgery, trauma, immobility or travel.  No cough or hemoptysis. No fever or chills.       Past Medical History  Diagnosis Date  . Cancer [facility])     testicular cancer in 2007  . Hypertension   . GERD (gastroesophageal reflux disease)   . Seizures ([place])     last seizure was 1 month ago; no meds per PCP.   Past Surgical History  Procedure Laterality Date  . Right testical removed  2007  . Appendectomy  2013 ruptured  . Esophagogastroduodenoscopy N/A 12/16/2012    Procedure: ESOPHAGOGASTRODUODENOSCOPY (EGD);  Surgeon: Rogene Houston, MD;  Location: [facility]ITE;  Service: Endoscopy;  Laterality: N/A;  255  . Cystoscopy w/ ureteral stent placement Left 04/28/2014    Procedure: CYSTOSCOPY WITH LEFT  RETROGRADE PYELOGRAM LEFT URETERAL STENT PLACEMENT;  Surgeon: Irine Seal, MD;  Location: [facility]S;  Service: Urology;  Laterality: Left;  . Ureteroscopy Left 04/28/2014    Procedure: URETEROSCOPY;  Surgeon: Irine Seal, MD;  Location: [facility];  Service: Urology;  Laterality: Left;  . Holmium laser application Left [id number]    Procedure: HOLMIUM LASER APPLICATION;  Surgeon: Irine Seal, MD;  Location: [facility];  Service: Urology;  Laterality: Left;  . Cystoscopy w/ ureteral stent removal Left 05/12/2014    Procedure: CYSTOSCOPY WITH LEFT URETERAL STENT REMOVAL;  Surgeon: Irine Seal, MD;  Location: [facility];  Service: Urology;  Laterality: Left;  . Cystoscopy with ureteroscopy Left 05/12/2014    Procedure: LEFT URETEROSCOPY WITH STONE EXTRACTION;  Surgeon: Irine Seal, MD;  Location: [facility];  Service: Urology;  Laterality: Left;   History reviewed. No pertinent family history. Social History  Substance Use Topics  . Smoking status: Former Smoker -- 1.00 packs/day for 1 years    Types: Cigarettes    Quit date: 05/12/1986  . Smokeless tobacco: None  . Alcohol Use: No    Review of Systems  Constitutional: Negative for fever and chills.  HENT: Negative for sore throat.   Eyes: Negative for redness.  Respiratory: Negative for cough and shortness of breath.   Cardiovascular: Positive for chest pain. Negative for palpitations and leg swelling.  Gastrointestinal: Negative for nausea, vomiting and abdominal pain.  Genitourinary: Negative for flank pain.  Musculoskeletal: Negative for back pain and neck pain.  Skin: Negative for rash.  Neurological: Negative for headaches.  Hematological: Does not bruise/bleed easily.  Psychiatric/Behavioral: Negative for confusion.      Allergies  Penicillins and Morphine and related  Home Medications   Prior to Admission medications   Medication Sig Start Date End Date Taking? Authorizing Provider  aspirin EC 81 MG tablet Take 324 mg by mouth once.   Yes Historical Provider, MD   BP 163/104 mmHg  Pulse 81  Temp(Src) 98.5 F (36.9 C) (Oral)  Resp 16  Ht 5\' 10"  (1.778 m)  Wt 90.719 kg  BMI 28.70 kg/m2  SpO2 93% Physical  Exam  Constitutional: He is oriented to person, place, and time. He appears well-developed and well-nourished. No distress.  HENT:  Mouth/Throat: Oropharynx is clear and moist.  Eyes: Conjunctivae are normal. No scleral icterus.  Neck: Neck supple. No tracheal deviation present.  Cardiovascular: Normal rate, regular rhythm, normal heart sounds and intact distal pulses.  Exam reveals no gallop and no friction rub.   No murmur heard. Pulmonary/Chest: Effort normal and breath sounds normal. No accessory muscle usage. No respiratory distress. He exhibits tenderness.  Abdominal: Soft. Bowel sounds are normal. He exhibits no distension. There is no tenderness.  Musculoskeletal: Normal range of motion. He exhibits no edema or tenderness.  Neurological: He is alert and oriented to person, place, and time.  Skin: Skin is warm and dry. No rash noted. He is not diaphoretic.  Psychiatric: He has a normal mood and affect.  Nursing note and vitals reviewed.   ED Course  Procedures (including critical care time) Labs Review   Results for orders placed or performed during the hospital encounter of [id number]  Basic metabolic panel  Result Value Ref Range   Sodium 138 135 - 145 mmol/L   Potassium 3.5 3.5 - 5.1 mmol/L   Chloride 104 101 - 111 mmol/L   CO2 26 22 - 32 mmol/L   Glucose, Bld 101 (H) 65 - 99 mg/dL   BUN 12 6 - 20 mg/dL   Creatinine, Ser 1.09 0.61 - 1.24 mg/dL   Calcium 8.7 (L) 8.9 - 10.3 mg/dL   GFR calc non Af Amer >60 >60 mL/min   GFR calc Af Amer >60 >60 mL/min   Anion gap 8 5 - 15  CBC  Result Value Ref Range   WBC 8.8 4.0 - 10.5 K/uL   RBC 5.03 4.22 - 5.81 MIL/uL   Hemoglobin 16.9 13.0 - 17.0 g/dL   HCT 47.1 39.0 - 52.0 %   MCV 93.6 78.0 - 100.0 fL   MCH 33.6 26.0 - 34.0 pg   MCHC 35.9 30.0 - 36.0 g/dL   RDW 12.3 11.5 - 15.5 %   Platelets 213 150 - 400 K/uL  Troponin I  Result Value Ref Range   Troponin I <0.03 <0.031 ng/mL  Troponin I  Result Value Ref Range    Troponin I <0.03 <0.031 ng/mL   Dg Chest 2 View  03/31/2015  CLINICAL DATA:  Chest pain centrally radiating to right chest. Nausea. EXAM: CHEST  2 VIEW COMPARISON:  None. FINDINGS: Lungs are well inflated without consolidation or effusion. Cardiomediastinal silhouette is within normal. There is minimal degenerative change of the spine. IMPRESSION: No active cardiopulmonary disease. Electronically Signed   By: Marin Olp M.D.   On: 03/31/2015 17:42      I have personally reviewed and evaluated these images and lab results as part of my medical decision-making.   EKG Interpretation   Date/Time:  Saturday March 31 2015 16:27:38 EST Ventricular Rate:  72 PR Interval:  188 QRS Duration: 89 QT Interval:  396 QTC Calculation: 433 R Axis:  27 Text Interpretation:  Sinus rhythm No significant change since last  tracing Confirmed by [facility]  MD, Lennette Bihari ([id number]) on 03/31/2015 4:32:31 PM      MDM   Iv ns. Monitor. Continuous pulse ox and monitor.   Ecg. Labs. Cxr.  Reviewed nursing notes and prior charts for additional history.   Hydrocodone for pain.   Initial and repeat trop neg.  Pts symptoms/exam appears most c/w musculoskel cp, not c/w acs.  On recheck, chest wall tenderness, reproducing symptoms.   Pt currently appears stable for d/c.      Lajean Saver, MD 03/31/15 2042

## 2015-03-31 NOTE — Discharge Instructions (Signed)
It was our pleasure to provide your ER care today - we hope that you feel better.  Take motrin or aleve as need for pain.   If pain not adequately controlled with the motrin, you may also take hydrocodone as need for pain. No driving when taking hydrocodone. Also, do not take tylenol or acetaminophen containing medication when taking hydrocodone.  Your blood pressure is high tonight - follow up with primary care doctor for recheck in the coming week.   Return to ER if worse, new symptoms, fevers, recurrent/persistent chest pain, difficulty breathing, other concern.  You were given pain medication in the ER - no driving for the next 4 hours.       Chest Wall Pain Chest wall pain is pain in or around the bones and muscles of your chest. Sometimes, an injury causes this pain. Sometimes, the cause may not be known. This pain may take several weeks or longer to get better. HOME CARE INSTRUCTIONS  Pay attention to any changes in your symptoms. Take these actions to help with your pain:   Rest as told by your health care provider.   Avoid activities that cause pain. These include any activities that use your chest muscles or your abdominal and side muscles to lift heavy items.   If directed, apply ice to the painful area:  Put ice in a plastic bag.  Place a towel between your skin and the bag.  Leave the ice on for 20 minutes, 2-3 times per day.  Take over-the-counter and prescription medicines only as told by your health care provider.  Do not use tobacco products, including cigarettes, chewing tobacco, and e-cigarettes. If you need help quitting, ask your health care provider.  Keep all follow-up visits as told by your health care provider. This is important. SEEK MEDICAL CARE IF:  You have a fever.  Your chest pain becomes worse.  You have new symptoms. SEEK IMMEDIATE MEDICAL CARE IF:  You have nausea or vomiting.  You feel sweaty or light-headed.  You have a cough  with phlegm (sputum) or you cough up blood.  You develop shortness of breath.   This information is not intended to replace advice given to you by your health care provider. Make sure you discuss any questions you have with your health care provider.   Document Released: 01/27/2005 Document Revised: 10/18/2014 Document Reviewed: 04/24/2014 Elsevier Interactive Patient Education 2016 Elsevier Inc.    Costochondritis Costochondritis, sometimes called Tietze syndrome, is a swelling and irritation (inflammation) of the tissue (cartilage) that connects your ribs with your breastbone (sternum). It causes pain in the chest and rib area. Costochondritis usually goes away on its own over time. It can take up to 6 weeks or longer to get better, especially if you are unable to limit your activities. CAUSES  Some cases of costochondritis have no known cause. Possible causes include:  Injury (trauma).  Exercise or activity such as lifting.  Severe coughing. SIGNS AND SYMPTOMS  Pain and tenderness in the chest and rib area.  Pain that gets worse when coughing or taking deep breaths.  Pain that gets worse with specific movements. DIAGNOSIS  Your health care provider will do a physical exam and ask about your symptoms. Chest X-rays or other tests may be done to rule out other problems. TREATMENT  Costochondritis usually goes away on its own over time. Your health care provider may prescribe medicine to help relieve pain. HOME CARE INSTRUCTIONS   Avoid exhausting physical activity.  Try not to strain your ribs during normal activity. This would include any activities using chest, abdominal, and side muscles, especially if heavy weights are used.  Apply ice to the affected area for the first 2 days after the pain begins.  Put ice in a plastic bag.  Place a towel between your skin and the bag.  Leave the ice on for 20 minutes, 2-3 times a day.  Only take over-the-counter or prescription  medicines as directed by your health care provider. SEEK MEDICAL CARE IF:  You have redness or swelling at the rib joints. These are signs of infection.  Your pain does not go away despite rest or medicine. SEEK IMMEDIATE MEDICAL CARE IF:   Your pain increases or you are very uncomfortable.  You have shortness of breath or difficulty breathing.  You cough up blood.  You have worse chest pains, sweating, or vomiting.  You have a fever or persistent symptoms for more than 2-3 days.  You have a fever and your symptoms suddenly get worse. MAKE SURE YOU:   Understand these instructions.  Will watch your condition.  Will get help right away if you are not doing well or get worse.   This information is not intended to replace advice given to you by your health care provider. Make sure you discuss any questions you have with your health care provider.   Document Released: 11/06/2004 Document Revised: 11/17/2012 Document Reviewed: 08/31/2012 Elsevier Interactive Patient Education 2016 [employer].    Hypertension Hypertension, commonly called high blood pressure, is when the force of blood pumping through your arteries is too strong. Your arteries are the blood vessels that carry blood from your heart throughout your body. A blood pressure reading consists of a higher number over a lower number, such as 110/72. The higher number (systolic) is the pressure inside your arteries when your heart pumps. The lower number (diastolic) is the pressure inside your arteries when your heart relaxes. Ideally you want your blood pressure below 120/80. Hypertension forces your heart to work harder to pump blood. Your arteries may become narrow or stiff. Having untreated or uncontrolled hypertension can cause heart attack, stroke, kidney disease, and other problems. RISK FACTORS Some risk factors for high blood pressure are controllable. Others are not.  Risk factors you cannot control include:    Race. You may be at higher risk if you are African American.  Age. Risk increases with age.  Gender. Men are at higher risk than women before age 47 years. After age 42, women are at higher risk than men. Risk factors you can control include:  Not getting enough exercise or physical activity.  Being overweight.  Getting too much fat, sugar, calories, or salt in your diet.  Drinking too much alcohol. SIGNS AND SYMPTOMS Hypertension does not usually cause signs or symptoms. Extremely high blood pressure (hypertensive crisis) may cause headache, anxiety, shortness of breath, and nosebleed. DIAGNOSIS To check if you have hypertension, your health care provider will measure your blood pressure while you are seated, with your arm held at the level of your heart. It should be measured at least twice using the same arm. Certain conditions can cause a difference in blood pressure between your right and left arms. A blood pressure reading that is higher than normal on one occasion does not mean that you need treatment. If it is not clear whether you have high blood pressure, you may be asked to return on a different day to have  your blood pressure checked again. Or, you may be asked to monitor your blood pressure at home for 1 or more weeks. TREATMENT Treating high blood pressure includes making lifestyle changes and possibly taking medicine. Living a healthy lifestyle can help lower high blood pressure. You may need to change some of your habits. Lifestyle changes may include:  Following the DASH diet. This diet is high in fruits, vegetables, and whole grains. It is low in salt, red meat, and added sugars.  Keep your sodium intake below 2,300 mg per day.  Getting at least 30-45 minutes of aerobic exercise at least 4 times per week.  Losing weight if necessary.  Not smoking.  Limiting alcoholic beverages.  Learning ways to reduce stress. Your health care provider may prescribe medicine  if lifestyle changes are not enough to get your blood pressure under control, and if one of the following is true:  You are 34-76 years of age and your systolic blood pressure is above 140.  You are 31 years of age or older, and your systolic blood pressure is above 150.  Your diastolic blood pressure is above 90.  You have diabetes, and your systolic blood pressure is over [id number] or your diastolic blood pressure is over 90.  You have kidney disease and your blood pressure is above 140/90.  You have heart disease and your blood pressure is above 140/90. Your personal target blood pressure may vary depending on your medical conditions, your age, and other factors. HOME CARE INSTRUCTIONS  Have your blood pressure rechecked as directed by your health care provider.   Take medicines only as directed by your health care provider. Follow the directions carefully. Blood pressure medicines must be taken as prescribed. The medicine does not work as well when you skip doses. Skipping doses also puts you at risk for problems.  Do not smoke.   Monitor your blood pressure at home as directed by your health care provider. SEEK MEDICAL CARE IF:   You think you are having a reaction to medicines taken.  You have recurrent headaches or feel dizzy.  You have swelling in your ankles.  You have trouble with your vision. SEEK IMMEDIATE MEDICAL CARE IF:  You develop a severe headache or confusion.  You have unusual weakness, numbness, or feel faint.  You have severe chest or abdominal pain.  You vomit repeatedly.  You have trouble breathing. MAKE SURE YOU:   Understand these instructions.  Will watch your condition.  Will get help right away if you are not doing well or get worse.   This information is not intended to replace advice given to you by your health care provider. Make sure you discuss any questions you have with your health care provider.   Document Released: 01/27/2005  Document Revised: 06/13/2014 Document Reviewed: 11/19/2012 Elsevier Interactive Patient Education 2016 [employer].    Emergency Department Resource Guide 1) Find a Doctor and Pay Out of Pocket Although you won't have to find out who is covered by your insurance plan, it is a good idea to ask around and get recommendations. You will then need to call the office and see if the doctor you have chosen will accept you as a new patient and what types of options they offer for patients who are self-pay. Some doctors offer discounts or will set up payment plans for their patients who do not have insurance, but you will need to ask so you aren't surprised when you get to your appointment.  2) Contact Your Local Health Department Not all health departments have doctors that can see patients for sick visits, but many do, so it is worth a call to see if yours does. If you don't know where your local health department is, you can check in your phone book. The CDC also has a tool to help you locate your state's health department, and many state websites also have listings of all of their local health departments.  3) Find a [facility] If your illness is not likely to be very severe or complicated, you may want to try a walk in clinic. These are popping up all over the country in pharmacies, drugstores, and shopping centers. They're usually staffed by nurse practitioners or physician assistants that have been trained to treat common illnesses and complaints. They're usually fairly quick and inexpensive. However, if you have serious medical issues or chronic medical problems, these are probably not your best option.  No Primary Care Doctor: - Call Health Connect at  [phone] - they can help you locate a primary care doctor that  accepts your insurance, provides certain services, etc. - Physician Referral Service- [phone]  Chronic Pain Problems: Organization         Address  Phone   Notes  [facility]  [phone] Patients need to be referred by their primary care doctor.   Medication Assistance: Organization         Address  Phone   Notes  [facility] Medication [facility] [address] [place], [place] [address] [phone] --Must be a resident of [facility] -- Must have NO insurance coverage whatsoever (no Medicaid/ Medicare, etc.) -- The pt. MUST have a primary care doctor that directs their care regularly and follows them in the community   Med[facility]ist  [phone]   [employer]  [phone]    Agencies that provide inexpensive medical care: Organization         Address  Phone   Notes  [place]  [phone]   [facility]    [phone]   [facility] [place], [place] [address] [phone]   [place] [address] [place] [phone]   Planned Parenthood    [phone]   [facility]    [phone]   [place] and [place] [address], [place] Phone:  [phone], Fax:  [phone] Hours of Operation:  9 am - 6 pm, M-F.  Also accepts Medicaid/Medicare and self-pay.  [facility] for [place], [address], [place] Phone: [phone], Fax: [phone]. Hours of Operation:  8:30 am - 5:30 pm, M-F.  Also accepts Medicaid and self-pay.  [facility] [place] [address] [place] Phone: [phone]   [place], [place], [place] [phone], Ext. [phone] Mondays & Thursdays: 7-9 AM.  First 15 patients are seen on a first come, first serve basis.    [place] Providers:  Organization         Address  Phone   Notes  [facility] [address], [place] [phone] Also accepts self-pay patients.  [place], [place]  [phone]   [place], [address]  [place] [phone]   [place] [address] [place] [phone]   Lucianne Lei [address], [place]   [phone] Only accepts [place] Access [place] patients after they have their name applied to their card.   Self-Pay (no insurance) in [facility]:  Organization         Address  Phone   Notes  Sickle Cell Patients, [facility] Internal [facility] [place] [phone]   [facility] [place] [phone]   [facility] [place]  [place], [place], [place] [phone]   [facility]/Dr. Osei-Bonsu  [address], [place] or [address], [place] [phone] Phone number for both [place] and [place] locations is the same.  [facility] and [facility] [address], [place] [phone]   [facility] [address], [place] or [address] [phone]   [facility] [address] [place] [phone], phone; [phone], fax Sees patients 1st and 3rd Saturday of every month.  Must not qualify for public or private insurance (i.e. Medicaid, Medicare, [place] Health Choice, Veterans' Benefits)  Household income should be no more than 200% of the poverty level The clinic cannot treat you if you are pregnant or think you are pregnant  Sexually transmitted diseases are not treated at the clinic.    Dental Care: Organization         Address  Phone  Notes  [facility] of [facility] [place] [phone] Accepts children up to age 64 who are enrolled in [place] or [place]; pregnant women with a Medicaid card; and children who have applied for  Medicaid or [place] Health Choice, but were declined, whose parents can pay a reduced fee at time of service.  [facility] Department of [facility]  [address], [place] [phone] Accepts children up to age 17 who are enrolled in [place] or [place]; pregnant women with a Medicaid card; and children who have applied for Medicaid or [place] Health Choice, but were declined, whose parents can pay a reduced fee at time of service.  [facility]  [place] [phone] Patients are seen by appointment only. Walk-ins are not accepted. [place] will see patients 23 years of age and older. Monday - Tuesday (8am-5pm) Most Wednesdays (8:30-5pm) $30 per visit, cash only  [facility] Adult [facility] Access PRO[facility]  [address], [facility] [phone] Patients are seen by appointment only. Walk-ins are not accepted. [place] will see patients 88 years of age and older. One Wednesday Evening (Monthly: Volunteer Based).  $30 per visit, cash only  [place]  [phone] for adults; Children under age 10, call Graduate Pediatric Dentistry at [phone]. Children aged 71-14, please call [phone] to request a pediatric application.  Dental services are provided in all areas of dental care including fillings, crowns and bridges, complete and partial dentures, implants, gum treatment, root canals, and extractions. Preventive care is also provided. Treatment is provided to both adults and children. Patients are selected via a lottery and there is often a waiting list.   [facility] [address], [place]  [phone] [email or web address]   [facility] [address] [place], [place] [phone], Ext. [phone] Second and Fourth Thursday of each month, opens at 6:30 AM; Clinic ends at 9 AM.  Patients are seen on a first-come first-served basis, and a limited number  are seen during each clinic.   [facility]  [address] Hillard Danker [place], [place] [phone]   Eligibility Requirements You must have lived in [place], [place], or [place] counties for at least the last three months.   You cannot be eligible for state or federal sponsored [employer], including [employer], [place], or [employer].   You generally cannot be eligible for healthcare insurance through your employer.    How to apply: Eligibility screenings are held every Tuesday and Wednesday afternoon from 1:00 pm until 4:00 pm. You do not need an appointment for the interview!  [facility] [address], [place], [place]   [place]  [place] Department  [place]  [phone]    Behavioral Health Resources in the Community: Intensive Outpatient Programs Organization         Address  Phone  Notes  Lima Emmons. [address] [place], [place] [phone]   [facility] Outpatient [address], [place], [place]   ADS: Alcohol & Drug Svcs [address] [place], [place]   [place] [address]  [place], [place] or [phone]   Substance Abuse Resources Organization         Address  Phone  Notes  Alcohol and Drug Services  [phone]   [place]  [phone]   [facility] [place]   Chinita Pester  [phone]   Residential & Outpatient Substance Abuse Program  [phone]   Psychological Services Organization         Address  Phone  Notes  [facility] [place]  [place]  [phone]   [place] [address] [place] or [phone]    Mobile Crisis Teams Organization         Address  Phone  Notes  Therapeutic  Alternatives, Mobile Crisis Care Unit  [phone]   [facility]ive Psychotherapeutic Services  [address] [place], [place]   [place] [address], [place] [phone]    Self-Help/Support Groups Organization         Address  Phone             Notes  [place]. of [place] - variety of support groups  [place] Call for more information  Narcotics Anonymous (NA), [facility] [address], [employer] [place]  2 meetings at this location   Special educational needs teacher         Address  Phone  Notes  ASAP Residential Treatment [place],    [place]  [phone]   [facility]  [address], [place] [id number], [place], [place]   [place], [place] [phone] Admissions: 8am-3pm M-F  Incentives Substance [place] [address],    [place], [place] [phone]   [facility] [address] Jadene Pierini [place], [place]   [facility] [address],  [place], [place]   [facility] Programs -  Intensive Outpatient [address] Kristeen [address], [place], [place] [phone]   [facility] ([place]  [place], [place] [phone] or [phone]   [facility]idential Treatment Services (RTS) [address], [place], [place] Accepts Medicaid  [facility] [place] [address],  [place] [phone] Substance Abuse/Addiction Treatment   [facility] Organization         Address  Phone  Notes  [facility]  [phone]   Domenic Schwab, PhD [address] Arlis Porta [place], [place]   [phone] or [phone]   [place], [place] [phone]   [address], [place], [place] [phone] Insurance/Medicaid/sponsorship through [facility] and Families  [address] [place]                                    [place], [place] [phone] [place] [address], [place] [phone]    Dr. Adele Schilder  [phone]   [facility] Dept. 1) [address] [place] 2) [place] 3)  [place] [address], [place] [phone]  [phone]   [place] [phone] or [phone] (After Hours)

## 2015-03-31 NOTE — ED Notes (Addendum)
Per EMS: Pt reports central cp that radiates to right side of chest.  Pt picked up washing machine by himself and then started to have cp.  Pt has htn but does not take any medication for it. Pt also mentions that he is seeing spots in his eyes when he stands up.   160/107, 84hr, 98& RA.

## 2015-10-27 ENCOUNTER — Emergency Department (HOSPITAL_COMMUNITY): Payer: Self-pay

## 2015-10-27 ENCOUNTER — Encounter (HOSPITAL_COMMUNITY): Payer: Self-pay | Admitting: Emergency Medicine

## 2015-10-27 ENCOUNTER — Emergency Department (HOSPITAL_COMMUNITY)
Admission: EM | Admit: 2015-10-27 | Discharge: 2015-10-27 | Disposition: A | Discharge status: Home / Self Care | Payer: Self-pay | Attending: Emergency Medicine | Admitting: Emergency Medicine

## 2015-10-27 DIAGNOSIS — Y929 Unspecified place or not applicable: Secondary | ICD-10-CM | POA: Insufficient documentation

## 2015-10-27 DIAGNOSIS — S300XXA Contusion of lower back and pelvis, initial encounter: Secondary | ICD-10-CM | POA: Insufficient documentation

## 2015-10-27 DIAGNOSIS — Z87891 Personal history of nicotine dependence: Secondary | ICD-10-CM | POA: Insufficient documentation

## 2015-10-27 DIAGNOSIS — N200 Calculus of kidney: Secondary | ICD-10-CM

## 2015-10-27 DIAGNOSIS — Y9389 Activity, other specified: Secondary | ICD-10-CM | POA: Insufficient documentation

## 2015-10-27 DIAGNOSIS — Z7982 Long term (current) use of aspirin: Secondary | ICD-10-CM | POA: Insufficient documentation

## 2015-10-27 DIAGNOSIS — Y990 Civilian activity done for income or pay: Secondary | ICD-10-CM | POA: Insufficient documentation

## 2015-10-27 DIAGNOSIS — I10 Essential (primary) hypertension: Secondary | ICD-10-CM | POA: Insufficient documentation

## 2015-10-27 DIAGNOSIS — W228XXA Striking against or struck by other objects, initial encounter: Secondary | ICD-10-CM | POA: Insufficient documentation

## 2015-10-27 LAB — URINALYSIS, ROUTINE W REFLEX MICROSCOPIC
Bilirubin Urine: NEGATIVE
Glucose, UA: NEGATIVE mg/dL
Ketones, ur: 15 mg/dL — AB
Leukocytes, UA: NEGATIVE
NITRITE: NEGATIVE
Protein, ur: 100 mg/dL — AB
Specific Gravity, Urine: 1.015 (ref 1.005–1.030)
pH: 8 (ref 5.0–8.0)

## 2015-10-27 LAB — BASIC METABOLIC PANEL
Anion gap: 9 (ref 5–15)
BUN: 17 mg/dL (ref 6–20)
CALCIUM: 9.3 mg/dL (ref 8.9–10.3)
CO2: 27 mmol/L (ref 22–32)
Chloride: 104 mmol/L (ref 101–111)
Creatinine, Ser: 1.45 mg/dL — ABNORMAL HIGH (ref 0.61–1.24)
GFR, EST NON AFRICAN AMERICAN: 58 mL/min — AB (ref >60)
Glucose, Bld: 111 mg/dL — ABNORMAL HIGH (ref 65–99)
Potassium: 3.8 mmol/L (ref 3.5–5.1)
Sodium: 140 mmol/L (ref 135–145)

## 2015-10-27 LAB — CBC WITH DIFFERENTIAL/PLATELET
BASOS PCT: 0 %
Basophils Absolute: 0 10*3/uL (ref 0.0–0.1)
EOS ABS: 0.1 10*3/uL (ref 0.0–0.7)
EOS PCT: 1 %
HCT: 44.6 % (ref 39.0–52.0)
Hemoglobin: 14.7 g/dL (ref 13.0–17.0)
Lymphocytes Relative: 11 %
Lymphs Abs: 1.3 10*3/uL (ref 0.7–4.0)
MCH: 31.6 pg (ref 26.0–34.0)
MCHC: 33 g/dL (ref 30.0–36.0)
MCV: 95.9 fL (ref 78.0–100.0)
Monocytes Absolute: 0.6 10*3/uL (ref 0.1–1.0)
Monocytes Relative: 5 %
NEUTROS PCT: 83 %
Neutro Abs: 10.1 10*3/uL — ABNORMAL HIGH (ref 1.7–7.7)
PLATELETS: 222 10*3/uL (ref 150–400)
RBC: 4.65 MIL/uL (ref 4.22–5.81)
RDW: 12.5 % (ref 11.5–15.5)
WBC: 12.1 10*3/uL — AB (ref 4.0–10.5)

## 2015-10-27 LAB — URINE MICROSCOPIC-ADD ON: SQUAMOUS EPITHELIAL / LPF: NONE SEEN

## 2015-10-27 MED ORDER — HYDROMORPHONE HCL 1 MG/ML IJ SOLN
1.0000 mg | Freq: Once | INTRAMUSCULAR | Status: AC
  Administered 2015-10-27: 1 mg via INTRAVENOUS
  Filled 2015-10-27: qty 1

## 2015-10-27 MED ORDER — KETOROLAC TROMETHAMINE 30 MG/ML IJ SOLN
30.0000 mg | Freq: Once | INTRAMUSCULAR | Status: AC
  Administered 2015-10-27: 30 mg via INTRAVENOUS
  Filled 2015-10-27: qty 1

## 2015-10-27 MED ORDER — IOPAMIDOL (ISOVUE-300) INJECTION 61%
100.0000 mL | Freq: Once | INTRAVENOUS | Status: AC | PRN
  Administered 2015-10-27: 100 mL via INTRAVENOUS

## 2015-10-27 MED ORDER — OXYCODONE-ACETAMINOPHEN 5-325 MG PO TABS: 1.0000 | ORAL_TABLET | Freq: Four times a day (QID) | ORAL | 0 refills | Status: DC | PRN

## 2015-10-27 MED ORDER — ONDANSETRON HCL 4 MG/2ML IJ SOLN
4.0000 mg | Freq: Once | INTRAMUSCULAR | Status: AC
  Administered 2015-10-27: 4 mg via INTRAVENOUS
  Filled 2015-10-27: qty 2

## 2015-10-27 NOTE — ED Provider Notes (Signed)
[facility] DEPT Provider Note   CSN: [id number] Arrival date & time: 10/27/15  1435     History   Chief Complaint Chief Complaint  Patient presents with  . Groin Injury    HPI Larry Hoover is a 45 y.o. male.  Patient is a 45 year old male with history of hypertension and testicular cancer. He presents for evaluation of severe pain to his lower pelvis and groin. 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 He reports some sort of lever on the back of the grease gun was not solidly in place, and kicked back and struck him in the groin. He reports pain with urination, however no blood. He reports severe pain in his testicle and lower abdomen.      Past Medical History:  Diagnosis Date  . Cancer [facility])    testicular cancer in 2007  . GERD (gastroesophageal reflux disease)   . Hypertension   . Seizures ([place])    last seizure was 1 month ago; no meds per PCP.    Patient Active Problem List   Diagnosis Date Noted  . Erosive esophagitis 01/10/2013  . Nausea alone 12/06/2012  . Abdominal pain, chronic, epigastric 12/06/2012  . Opiate abuse, continuous 05/11/2012    Class: Acute  . Benzodiazepine abuse, continuous 05/11/2012  . Depressive disorder, not elsewhere classified 05/08/2012  . Polysubstance (including opioids) dependence w/o physiol dependence ([place]) 05/08/2012  . Mood disorder in conditions classified elsewhere 05/08/2012  . Chronic pain syndrome 05/08/2012    Past Surgical History:  Procedure Laterality Date  . APPENDECTOMY  2013 ruptured  . CYSTOSCOPY W/ URETERAL STENT PLACEMENT Left 04/28/2014   Procedure: CYSTOSCOPY WITH LEFT  RETROGRADE PYELOGRAM LEFT URETERAL STENT PLACEMENT;  Surgeon: Irine Seal, MD;  Location: [facility];  Service: Urology;  Laterality: Left;  . CYSTOSCOPY W/ URETERAL STENT REMOVAL Left 05/12/2014   Procedure: CYSTOSCOPY WITH LEFT URETERAL STENT REMOVAL;  Surgeon: Irine Seal, MD;  Location: [facility];  Service: Urology;  Laterality:  Left;  . CYSTOSCOPY WITH URETEROSCOPY Left 05/12/2014   Procedure: LEFT URETEROSCOPY WITH STONE EXTRACTION;  Surgeon: Irine Seal, MD;  Location: [facility];  Service: Urology;  Laterality: Left;  . ESOPHAGOGASTRODUODENOSCOPY N/A 12/16/2012   Procedure: ESOPHAGOGASTRODUODENOSCOPY (EGD);  Surgeon: Rogene Houston, MD;  Location[facility];  Service: Endoscopy;  Laterality: N/A;  255  . HOLMIUM LASER APPLICATION Left [id number]   Procedure: HOLMIUM LASER APPLICATION;  Surgeon: Irine Seal, MD;  Location: [facility];  Service: Urology;  Laterality: Left;  . Right testical removed  2007  . URETEROSCOPY Left 04/28/2014   Procedure: URETEROSCOPY;  Surgeon: Irine Seal, MD;  Location: [facility];  Service: Urology;  Laterality: Left;       Home Medications    Prior to Admission medications   Medication Sig Start Date End Date Taking? Authorizing Provider  aspirin EC 81 MG tablet Take 324 mg by mouth once.    Historical Provider, MD  HYDROcodone-acetaminophen (NORCO/VICODIN) 5-325 MG tablet Take 1-2 tablets by mouth every 6 (six) hours as needed for moderate pain. 03/31/15   Lajean Saver, MD    Family History History reviewed. No pertinent family history.  Social History Social History  Substance Use Topics  . Smoking status: Former Smoker    Packs/day: 1.00    Years: 1.00    Types: Cigarettes    Quit date: 05/12/1986  . Smokeless tobacco: Never Used  . Alcohol use No     Allergies   Penicillins and Morphine and  related   Review of Systems Review of Systems  All other systems reviewed and are negative.    Physical Exam Updated Vital Signs BP 164/94 (BP Location: Left Arm)   Pulse 63   Temp 98.1 F (36.7 C) (Oral)   Resp 18   Ht 5\' 10"  (1.778 m)   Wt 190 lb (86.2 kg)   SpO2 100%   BMI 27.26 kg/m   Physical Exam  Constitutional: He is oriented to person, place, and time. He appears well-developed and well-nourished. No distress.  HENT:  Head: Normocephalic and atraumatic.    Mouth/Throat: Oropharynx is clear and moist.  Neck: Normal range of motion. Neck supple.  Cardiovascular: Normal rate and regular rhythm.  Exam reveals no friction rub.   No murmur heard. Pulmonary/Chest: Effort normal and breath sounds normal. No respiratory distress. He has no wheezes. He has no rales.  Abdominal: Soft. Bowel sounds are normal. He exhibits no distension. There is tenderness.  There is tenderness to palpation of the suprapubic region. The pelvis is stable.  Genitourinary: Penis normal.  Genitourinary Comments: Patient has a solitary testicle. There is no bruising or swelling to the scrotum and the testicle is freely mobile. The penis is normal in appearance. There is no swelling or blood at the meatus.  Musculoskeletal: Normal range of motion. He exhibits no edema.  Neurological: He is alert and oriented to person, place, and time. Coordination normal.  Skin: Skin is warm and dry. He is not diaphoretic.  Nursing note and vitals reviewed.    ED Treatments / Results  Labs (all labs ordered are listed, but only abnormal results are displayed) Labs Reviewed  BASIC METABOLIC PANEL  CBC WITH DIFFERENTIAL/PLATELET  URINALYSIS, ROUTINE W REFLEX MICROSCOPIC (NOT AT [facility])    EKG  EKG Interpretation None       Radiology No results found.  Procedures Procedures (including critical care time)  Medications Ordered in ED Medications - No data to display   Initial Impression / Assessment and Plan / ED Course  I have reviewed the triage vital signs and the nursing notes.  Pertinent labs & imaging results that were available during my care of the patient were reviewed by me and considered in my medical decision making (see chart for details).  Clinical Course    Patient presents here with complaints of severe groin pain. He reports that this started after he was struck in the lower abdomen with a tool at work. He was complaining of severe pain that was out of  proportion with physical exam findings. He was given Toradol with some relief. Due to the degree of his discomfort, I elected to obtain a CT scan of the abdomen and pelvis. My original concerns were for some sort of traumatic injury. Interestingly this revealed a 9 mm calculus at the right UVJ. His urinalysis also reveals hematuria.  I see no evidence for traumatic injury and suspected this renal calculus became obstructing coincidently with the injury. Either way, he is feeling better after Toradol and allotted I believe is appropriate for discharge. He has had a kidney stone removed in the past and I will have him follow-up with his urologist on Monday.  Final Clinical Impressions(s) / ED Diagnoses   Final diagnoses:  None    New Prescriptions New Prescriptions   No medications on file     Veryl Speak, MD 10/27/15 [id number]

## 2015-10-27 NOTE — ED Notes (Signed)
Percocet pre-pack given to pt with instructions to take 1-2 tablets q 6 hours PRN pain.

## 2015-10-27 NOTE — ED Triage Notes (Addendum)
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 PT now stated urinary retention and right testicle and lower abdominal pain x2.5 hours. PT stated his family member gave him xanax 1mg  and hydrocodone 1 tablet at 1300 today but was not effective.

## 2015-10-27 NOTE — Discharge Instructions (Signed)
Percocet as prescribed as needed for pain.   Follow-up with urology on Monday. The contact information for [facility] in [place] has been provided in this discharge summary for you to call and make these arrangements.  Return to the emergency department if you develop high fever, worsening pain, or other new and concerning symptoms.

## 2015-10-30 MED FILL — Oxycodone w/ Acetaminophen Tab 5-325 MG: ORAL | Qty: 6 | Status: AC

## 2015-11-06 ENCOUNTER — Emergency Department (HOSPITAL_COMMUNITY): Payer: Self-pay

## 2015-11-06 ENCOUNTER — Encounter (HOSPITAL_COMMUNITY): Payer: Self-pay | Admitting: Emergency Medicine

## 2015-11-06 ENCOUNTER — Emergency Department (HOSPITAL_COMMUNITY)
Admission: EM | Admit: 2015-11-06 | Discharge: 2015-11-06 | Disposition: A | Discharge status: Home / Self Care | Payer: Self-pay | Attending: Emergency Medicine | Admitting: Emergency Medicine

## 2015-11-06 DIAGNOSIS — N201 Calculus of ureter: Secondary | ICD-10-CM | POA: Insufficient documentation

## 2015-11-06 DIAGNOSIS — I10 Essential (primary) hypertension: Secondary | ICD-10-CM | POA: Insufficient documentation

## 2015-11-06 DIAGNOSIS — N23 Unspecified renal colic: Secondary | ICD-10-CM

## 2015-11-06 DIAGNOSIS — Z8547 Personal history of malignant neoplasm of testis: Secondary | ICD-10-CM | POA: Insufficient documentation

## 2015-11-06 DIAGNOSIS — Z87891 Personal history of nicotine dependence: Secondary | ICD-10-CM | POA: Insufficient documentation

## 2015-11-06 LAB — COMPREHENSIVE METABOLIC PANEL
ALK PHOS: 76 U/L (ref 38–126)
ALT: 22 U/L (ref 17–63)
ANION GAP: 10 (ref 5–15)
AST: 18 U/L (ref 15–41)
Albumin: 5 g/dL (ref 3.5–5.0)
BUN: 15 mg/dL (ref 6–20)
CALCIUM: 9.5 mg/dL (ref 8.9–10.3)
CO2: 26 mmol/L (ref 22–32)
Chloride: 101 mmol/L (ref 101–111)
Creatinine, Ser: 1.14 mg/dL (ref 0.61–1.24)
GFR calc non Af Amer: >60 mL/min (ref >60)
Glucose, Bld: 85 mg/dL (ref 65–99)
Potassium: 3.7 mmol/L (ref 3.5–5.1)
SODIUM: 137 mmol/L (ref 135–145)
Total Bilirubin: 0.7 mg/dL (ref 0.3–1.2)
Total Protein: 8.4 g/dL — ABNORMAL HIGH (ref 6.5–8.1)

## 2015-11-06 LAB — CBC WITH DIFFERENTIAL/PLATELET
BASOS ABS: 0 10*3/uL (ref 0.0–0.1)
BASOS PCT: 0 %
Eosinophils Absolute: 0.4 10*3/uL (ref 0.0–0.7)
Eosinophils Relative: 4 %
HEMATOCRIT: 52.9 % — AB (ref 39.0–52.0)
HEMOGLOBIN: 18.7 g/dL — AB (ref 13.0–17.0)
Lymphocytes Relative: 31 %
Lymphs Abs: 3.2 10*3/uL (ref 0.7–4.0)
MCH: 33.7 pg (ref 26.0–34.0)
MCHC: 35.3 g/dL (ref 30.0–36.0)
MCV: 95.3 fL (ref 78.0–100.0)
MONOS PCT: 7 %
Monocytes Absolute: 0.7 10*3/uL (ref 0.1–1.0)
NEUTROS ABS: 5.9 10*3/uL (ref 1.7–7.7)
NEUTROS PCT: 58 %
Platelets: 246 10*3/uL (ref 150–400)
RBC: 5.55 MIL/uL (ref 4.22–5.81)
RDW: 12.5 % (ref 11.5–15.5)
WBC: 10.2 10*3/uL (ref 4.0–10.5)

## 2015-11-06 LAB — URINALYSIS, ROUTINE W REFLEX MICROSCOPIC
Bilirubin Urine: NEGATIVE
Glucose, UA: NEGATIVE mg/dL
Ketones, ur: NEGATIVE mg/dL
LEUKOCYTES UA: NEGATIVE
NITRITE: NEGATIVE
PH: 6 (ref 5.0–8.0)
Protein, ur: NEGATIVE mg/dL
SPECIFIC GRAVITY, URINE: 1.025 (ref 1.005–1.030)

## 2015-11-06 LAB — URINE MICROSCOPIC-ADD ON

## 2015-11-06 MED ORDER — PROMETHAZINE HCL 25 MG/ML IJ SOLN
25.0000 mg | Freq: Once | INTRAMUSCULAR | Status: AC
  Administered 2015-11-06: 25 mg via INTRAVENOUS
  Filled 2015-11-06: qty 1

## 2015-11-06 MED ORDER — ONDANSETRON HCL 4 MG/2ML IJ SOLN
4.0000 mg | Freq: Once | INTRAMUSCULAR | Status: AC
  Administered 2015-11-06: 4 mg via INTRAVENOUS
  Filled 2015-11-06: qty 2

## 2015-11-06 MED ORDER — HYDROMORPHONE HCL 1 MG/ML IJ SOLN
1.0000 mg | Freq: Once | INTRAMUSCULAR | Status: AC
  Administered 2015-11-06: 1 mg via INTRAVENOUS
  Filled 2015-11-06: qty 1

## 2015-11-06 MED ORDER — OXYCODONE-ACETAMINOPHEN 5-325 MG PO TABS: 1.0000 | ORAL_TABLET | Freq: Four times a day (QID) | ORAL | 0 refills | Status: AC | PRN

## 2015-11-06 MED ORDER — SODIUM CHLORIDE 0.9 % IV BOLUS (SEPSIS)
1000.0000 mL | Freq: Once | INTRAVENOUS | Status: AC
  Administered 2015-11-06: 1000 mL via INTRAVENOUS

## 2015-11-06 MED ORDER — TAMSULOSIN HCL 0.4 MG PO CAPS: 0.4000 mg | ORAL_CAPSULE | Freq: Every day | ORAL | 0 refills | Status: DC

## 2015-11-06 MED ORDER — PROMETHAZINE HCL 25 MG PO TABS: 25.0000 mg | ORAL_TABLET | Freq: Four times a day (QID) | ORAL | 0 refills | Status: DC | PRN

## 2015-11-06 NOTE — ED Triage Notes (Signed)
Pt reports kidney stones diagnosed 2 weeks ago, pt states the stones are stuck in his bladder.  Pt has dysuria.  Pt denies n/v/d.

## 2015-11-06 NOTE — ED Provider Notes (Signed)
[facility] DEPT Provider Note   CSN: [id number] Arrival date & time: 11/06/15  1208     History   Chief Complaint Chief Complaint  Patient presents with  . Flank Pain    HPI Larry Hoover is a 45 y.o. male.  Pt presents to the ED today with right sided flank pain.  He was seen here on the 16th for a possible groin injury and was diagnosed with a kidney stone.  The stone was 9 mm in the right UVJ and showed moderate obstruction.  The pt said that he called urology, and was able to get an appt for tomorrow at 11:30.  The pt said that the pain was severe and he could not wait, so he came in today.      Past Medical History:  Diagnosis Date  . Cancer [facility])    testicular cancer in 2007  . GERD (gastroesophageal reflux disease)   . Hypertension   . Seizures ([place])    last seizure was 1 month ago; no meds per PCP.    Patient Active Problem List   Diagnosis Date Noted  . Erosive esophagitis 01/10/2013  . Nausea alone 12/06/2012  . Abdominal pain, chronic, epigastric 12/06/2012  . Opiate abuse, continuous 05/11/2012    Class: Acute  . Benzodiazepine abuse, continuous 05/11/2012  . Depressive disorder, not elsewhere classified 05/08/2012  . Polysubstance (including opioids) dependence w/o physiol dependence ([place]) 05/08/2012  . Mood disorder in conditions classified elsewhere 05/08/2012  . Chronic pain syndrome 05/08/2012    Past Surgical History:  Procedure Laterality Date  . APPENDECTOMY  2013 ruptured  . CYSTOSCOPY W/ URETERAL STENT PLACEMENT Left 04/28/2014   Procedure: CYSTOSCOPY WITH LEFT  RETROGRADE PYELOGRAM LEFT URETERAL STENT PLACEMENT;  Surgeon: Irine Seal, MD;  Location: [facility];  Service: Urology;  Laterality: Left;  . CYSTOSCOPY W/ URETERAL STENT REMOVAL Left 05/12/2014   Procedure: CYSTOSCOPY WITH LEFT URETERAL STENT REMOVAL;  Surgeon: Irine Seal, MD;  Location: [facility];  Service: Urology;  Laterality: Left;  . CYSTOSCOPY WITH URETEROSCOPY Left 05/12/2014   Procedure: LEFT URETEROSCOPY WITH STONE EXTRACTION;  Surgeon: Irine Seal, MD;  Location: [facility];  Service: Urology;  Laterality: Left;  . ESOPHAGOGASTRODUODENOSCOPY N/A 12/16/2012   Procedure: ESOPHAGOGASTRODUODENOSCOPY (EGD);  Surgeon: Rogene Houston, MD;  Location[facility];  Service: Endoscopy;  Laterality: N/A;  255  . HOLMIUM LASER APPLICATION Left [id number]   Procedure: HOLMIUM LASER APPLICATION;  Surgeon: Irine Seal, MD;  Location: [facility];  Service: Urology;  Laterality: Left;  . Right testical removed  2007  . URETEROSCOPY Left 04/28/2014   Procedure: URETEROSCOPY;  Surgeon: Irine Seal, MD;  Location: [facility];  Service: Urology;  Laterality: Left;       Home Medications    Prior to Admission medications   Medication Sig Start Date End Date Taking? Authorizing Provider  oxyCODONE-acetaminophen (PERCOCET) 5-325 MG tablet Take 1-2 tablets by mouth every 6 (six) hours as needed. 11/06/15   Isla Pence, MD  promethazine (PHENERGAN) 25 MG tablet Take 1 tablet (25 mg total) by mouth every 6 (six) hours as needed for nausea or vomiting. 11/06/15   Isla Pence, MD  tamsulosin (FLOMAX) 0.4 MG CAPS capsule Take 1 capsule (0.4 mg total) by mouth daily. 11/06/15   Isla Pence, MD    Family History History reviewed. No pertinent family history.  Social History Social History  Substance Use Topics  . Smoking status: Former Smoker    Packs/day: 1.00    Years:  1.00    Types: Cigarettes    Quit date: 05/12/1986  . Smokeless tobacco: Never Used  . Alcohol use No     Allergies   Penicillins and Morphine and related   Review of Systems Review of Systems  Gastrointestinal: Positive for abdominal pain, nausea and vomiting.  All other systems reviewed and are negative.    Physical Exam Updated Vital Signs BP (!) 158/112   Pulse 70   Temp 97.8 F (36.6 C) (Oral)   Resp 20   Ht 5\' 10"  (1.778 m)   Wt 188 lb (85.3 kg)   SpO2 97%   BMI 26.98 kg/m   Physical Exam    Constitutional: He is oriented to person, place, and time. He appears well-developed and well-nourished.  HENT:  Head: Normocephalic and atraumatic.  Right Ear: External ear normal.  Left Ear: External ear normal.  Nose: Nose normal.  Mouth/Throat: Oropharynx is clear and moist.  Eyes: Conjunctivae and EOM are normal. Pupils are equal, round, and reactive to light.  Neck: Normal range of motion. Neck supple.  Cardiovascular: Normal rate, regular rhythm, normal heart sounds and intact distal pulses.   Pulmonary/Chest: Effort normal and breath sounds normal.  Abdominal: Soft. Bowel sounds are normal. There is tenderness in the right lower quadrant.  Musculoskeletal: Normal range of motion.  Neurological: He is alert and oriented to person, place, and time.  Skin: Skin is warm and dry.  Psychiatric: He has a normal mood and affect. His behavior is normal. Judgment and thought content normal.  Nursing note and vitals reviewed.    ED Treatments / Results  Labs (all labs ordered are listed, but only abnormal results are displayed) Labs Reviewed  COMPREHENSIVE METABOLIC PANEL - Abnormal; Notable for the following:       Result Value   Total Protein 8.4 (*)    All other components within normal limits  CBC WITH DIFFERENTIAL/PLATELET - Abnormal; Notable for the following:    Hemoglobin 18.7 (*)    HCT 52.9 (*)    All other components within normal limits  URINALYSIS, ROUTINE W REFLEX MICROSCOPIC (NOT AT [facility]) - Abnormal; Notable for the following:    Hgb urine dipstick MODERATE (*)    All other components within normal limits  URINE MICROSCOPIC-ADD ON - Abnormal; Notable for the following:    Squamous Epithelial / LPF 0-5 (*)    Bacteria, UA RARE (*)    All other components within normal limits    EKG  EKG Interpretation None       Radiology Dg Abdomen 1 View  Result Date: 11/06/2015 CLINICAL DATA:  Nephrolithiasis EXAM: ABDOMEN - 1 VIEW COMPARISON:  CT abdomen pelvis  10/27/2015, abdominal radiograph 05/25/2014. FINDINGS: Focal hyperdensity in the expected location of the right ureterovesical junction is consistent with the ureteral stone seen on the recent CT. No other abnormal calcifications are identified. Normal bowel gas pattern. Surgical clips are present in the right lower quadrant. IMPRESSION: Right ureterovesical junction stone, likely in unchanged position compared to CT of 10/27/2015. Electronically Signed   By: Ulyses Jarred M.D.   On: 11/06/2015 15:19    Procedures Procedures (including critical care time)  Medications Ordered in ED Medications  sodium chloride 0.9 % bolus 1,000 mL (1,000 mLs Intravenous New Bag/Given 11/06/15 1445)  ondansetron (ZOFRAN) injection 4 mg (4 mg Intravenous Given 11/06/15 1445)  HYDROmorphone (DILAUDID) injection 1 mg (1 mg Intravenous Given 11/06/15 1446)  promethazine (PHENERGAN) injection 25 mg (25 mg Intravenous Given 11/06/15  1542)     Initial Impression / Assessment and Plan / ED Course  I have reviewed the triage vital signs and the nursing notes.  Pertinent labs & imaging results that were available during my care of the patient were reviewed by me and considered in my medical decision making (see chart for details).  Clinical Course    Pt feels much better.  It looks like his stone is not moving and will not pass on its own.  The pt offered the choice of admission, but wants to go home.  He has an appt tomorrow and thinks he can wait until then.  The pt knows that he can return for any concerns.  Final Clinical Impressions(s) / ED Diagnoses   Final diagnoses:  Ureteral colic  Ureterolithiasis    New Prescriptions New Prescriptions   PROMETHAZINE (PHENERGAN) 25 MG TABLET    Take 1 tablet (25 mg total) by mouth every 6 (six) hours as needed for nausea or vomiting.   TAMSULOSIN (FLOMAX) 0.4 MG CAPS CAPSULE    Take 1 capsule (0.4 mg total) by mouth daily.     Isla Pence, MD 11/06/15 [phone]

## 2015-11-07 ENCOUNTER — Ambulatory Visit (INDEPENDENT_AMBULATORY_CARE_PROVIDER_SITE_OTHER): Payer: Self-pay | Admitting: Urology

## 2015-11-07 DIAGNOSIS — N201 Calculus of ureter: Secondary | ICD-10-CM

## 2015-11-08 ENCOUNTER — Encounter (HOSPITAL_COMMUNITY)
Admission: RE | Admit: 2015-11-08 | Discharge: 2015-11-08 | Disposition: A | Discharge status: Home / Self Care | Payer: Self-pay | Source: Ambulatory Visit | Attending: Urology | Admitting: Urology

## 2015-11-08 ENCOUNTER — Other Ambulatory Visit: Payer: Self-pay

## 2015-11-08 ENCOUNTER — Encounter (HOSPITAL_COMMUNITY): Payer: Self-pay

## 2015-11-08 DIAGNOSIS — N211 Calculus in urethra: Secondary | ICD-10-CM | POA: Insufficient documentation

## 2015-11-08 HISTORY — DX: Personal history of other (healed) physical injury and trauma: Z87.828

## 2015-11-08 NOTE — Progress Notes (Signed)
   11/08/15 1326  OBSTRUCTIVE SLEEP APNEA  Have you ever been diagnosed with sleep apnea through a sleep study? No  Do you snore loudly (loud enough to be heard through closed doors)?  1  Do you often feel tired, fatigued, or sleepy during the daytime (such as falling asleep during driving or talking to someone)? 1  Has anyone observed you stop breathing during your sleep? 1  Do you have, or are you being treated for high blood pressure? 1  BMI more than 35 kg/m2? 0  Age > 50 (1-yes) 0  Neck circumference greater than:Male 16 inches or larger, Male 17inches or larger? 0  Male Gender (Yes=1) 1  Obstructive Sleep Apnea Score 5  Score 5 or greater  Results sent to PCP

## 2015-11-08 NOTE — Patient Instructions (Addendum)
Larry Hoover  11/08/2015     @PREFPERIOPPHARMACY @   Your procedure is scheduled on  9/29/ 2017   Report to [facility] at  1150  A.M.  Call this number if you have problems the morning of surgery:  [phone]   Remember:  Do not eat food or drink liquids after midnight.  Take these medicines the morning of surgery with A SIP OF WATER  Oxycodone, phenergan, flomax.   Do not wear jewelry, make-up or nail polish.  Do not wear lotions, powders, or perfumes, or deoderant.  Do not shave 48 hours prior to surgery.  Men may shave face and neck.  Do not bring valuables to the hospital.  [facility] is not responsible for any belongings or valuables.  Contacts, dentures or bridgework may not be worn into surgery.  Leave your suitcase in the car.  After surgery it may be brought to your room.  For patients admitted to the hospital, discharge time will be determined by your treatment team.  Patients discharged the day of surgery will not be allowed to drive home.   Name and phone number of your driver:   family Special instructions:  none  Please read over the following fact sheets that you were given. Anesthesia Post-op Instructions and Care and Recovery After Surgery       Cystoscopy Cystoscopy is a procedure that is used to help your caregiver diagnose and sometimes treat conditions that affect your lower urinary tract. Your lower urinary tract includes your bladder and the tube through which urine passes from your bladder out of your body (urethra). Cystoscopy is performed with a thin, tube-shaped instrument (cystoscope). The cystoscope has lenses and a light at the end so that your caregiver can see inside your bladder. The cystoscope is inserted at the entrance of your urethra. Your caregiver guides it through your urethra and into your bladder. There are two main types of cystoscopy:  Flexible cystoscopy (with a flexible cystoscope).  Rigid cystoscopy (with a  rigid cystoscope). Cystoscopy may be recommended for many conditions, including:  Urinary tract infections.  Blood in your urine (hematuria).  Loss of bladder control (urinary incontinence) or overactive bladder.  Unusual cells found in a urine sample.  Urinary blockage.  Painful urination. Cystoscopy may also be done to remove a sample of your tissue to be checked under a microscope (biopsy). It may also be done to remove or destroy bladder stones. LET YOUR CAREGIVER KNOW ABOUT:  Allergies to food or medicine.  Medicines taken, including vitamins, herbs, eyedrops, over-the-counter medicines, and creams.  Use of steroids (by mouth or creams).  Previous problems with anesthetics or numbing medicines.  History of bleeding problems or blood clots.  Previous surgery.  Other health problems, including diabetes and kidney problems.  Possibility of pregnancy, if this applies. PROCEDURE The area around the opening to your urethra will be cleaned. A medicine to numb your urethra (local anesthetic) is used. If a tissue sample or stone is removed during the procedure, you may be given a medicine to make you sleep (general anesthetic). Your caregiver will gently insert the tip of the cystoscope into your urethra. The cystoscope will be slowly glided through your urethra and into your bladder. Sterile fluid will flow through the cystoscope and into your bladder. The fluid will expand and stretch your bladder. This gives your caregiver a better view of your bladder walls. The procedure lasts about 15-20 minutes. AFTER  THE PROCEDURE If a local anesthetic is used, you will be allowed to go home as soon as you are ready. If a general anesthetic is used, you will be taken to a recovery area until you are stable. You may have temporary bleeding and burning on urination.   This information is not intended to replace advice given to you by your health care provider. Make sure you discuss any  questions you have with your health care provider.   Document Released: 01/25/2000 Document Revised: 02/17/2014 Document Reviewed: 07/21/2011 Elsevier Interactive Patient Education 2016 [place]. Cystoscopy, Care After Refer to this sheet in the next few weeks. These instructions provide you with information on caring for yourself after your procedure. Your caregiver may also give you more specific instructions. Your treatment has been planned according to current medical practices, but problems sometimes occur. Call your caregiver if you have any problems or questions after your procedure. HOME CARE INSTRUCTIONS  Things you can do to ease any discomfort after your procedure include:  Drinking enough water and fluids to keep your urine clear or pale yellow.  Taking a warm bath to relieve any burning feelings. SEEK IMMEDIATE MEDICAL CARE IF:   You have an increase in blood in your urine.  You notice blood clots in your urine.  You have difficulty passing urine.  You have the chills.  You have abdominal pain.  You have a fever or persistent symptoms for more than 2-3 days.  You have a fever and your symptoms suddenly get worse. MAKE SURE YOU:   Understand these instructions.  Will watch your condition.  Will get help right away if you are not doing well or get worse.   This information is not intended to replace advice given to you by your health care provider. Make sure you discuss any questions you have with your health care provider.   Document Released: 08/16/2004 Document Revised: 02/17/2014 Document Reviewed: 07/21/2011 Elsevier Interactive Patient Education 2016 [employer] PATIENT INSTRUCTIONS POST-ANESTHESIA  IMMEDIATELY FOLLOWING SURGERY:  Do not drive or operate machinery for the first twenty four hours after surgery.  Do not make any important decisions for twenty four hours after surgery or while taking narcotic pain medications or sedatives.  If you  develop intractable nausea and vomiting or a severe headache please notify your doctor immediately.  FOLLOW-UP:  Please make an appointment with your surgeon as instructed. You do not need to follow up with anesthesia unless specifically instructed to do so.  WOUND CARE INSTRUCTIONS (if applicable):  Keep a dry clean dressing on the anesthesia/puncture wound site if there is drainage.  Once the wound has quit draining you may leave it open to air.  Generally you should leave the bandage intact for twenty four hours unless there is drainage.  If the epidural site drains for more than 36-48 hours please call the anesthesia department.  QUESTIONS?:  Please feel free to call your physician or the hospital operator if you have any questions, and they will be happy to assist you.

## 2015-11-08 NOTE — Pre-Procedure Instructions (Signed)
Patient given information to sign up for my chart at home. 

## 2015-11-09 ENCOUNTER — Encounter (HOSPITAL_COMMUNITY): Payer: Self-pay | Admitting: *Deleted

## 2015-11-09 ENCOUNTER — Ambulatory Visit (HOSPITAL_COMMUNITY)
Admission: RE | Admit: 2015-11-09 | Discharge: 2015-11-09 | Disposition: A | Discharge status: Home / Self Care | Payer: Self-pay | Source: Ambulatory Visit | Attending: Urology | Admitting: Urology

## 2015-11-09 ENCOUNTER — Ambulatory Visit (HOSPITAL_COMMUNITY): Payer: Self-pay | Admitting: Anesthesiology

## 2015-11-09 ENCOUNTER — Ambulatory Visit (HOSPITAL_COMMUNITY): Payer: Self-pay

## 2015-11-09 ENCOUNTER — Encounter (HOSPITAL_COMMUNITY)
Admission: RE | Disposition: A | Discharge status: Home / Self Care | Payer: Self-pay | Source: Ambulatory Visit | Attending: Urology

## 2015-11-09 DIAGNOSIS — K219 Gastro-esophageal reflux disease without esophagitis: Secondary | ICD-10-CM | POA: Insufficient documentation

## 2015-11-09 DIAGNOSIS — N201 Calculus of ureter: Secondary | ICD-10-CM

## 2015-11-09 DIAGNOSIS — Z880 Allergy status to penicillin: Secondary | ICD-10-CM | POA: Insufficient documentation

## 2015-11-09 DIAGNOSIS — N132 Hydronephrosis with renal and ureteral calculous obstruction: Secondary | ICD-10-CM | POA: Insufficient documentation

## 2015-11-09 DIAGNOSIS — I10 Essential (primary) hypertension: Secondary | ICD-10-CM | POA: Insufficient documentation

## 2015-11-09 DIAGNOSIS — R569 Unspecified convulsions: Secondary | ICD-10-CM | POA: Insufficient documentation

## 2015-11-09 DIAGNOSIS — Z79899 Other long term (current) drug therapy: Secondary | ICD-10-CM | POA: Insufficient documentation

## 2015-11-09 DIAGNOSIS — G4730 Sleep apnea, unspecified: Secondary | ICD-10-CM | POA: Insufficient documentation

## 2015-11-09 DIAGNOSIS — Z87891 Personal history of nicotine dependence: Secondary | ICD-10-CM | POA: Insufficient documentation

## 2015-11-09 DIAGNOSIS — Z885 Allergy status to narcotic agent status: Secondary | ICD-10-CM | POA: Insufficient documentation

## 2015-11-09 DIAGNOSIS — Z8547 Personal history of malignant neoplasm of testis: Secondary | ICD-10-CM | POA: Insufficient documentation

## 2015-11-09 HISTORY — PX: CYSTOSCOPY/RETROGRADE/URETEROSCOPY/STONE EXTRACTION WITH BASKET: SHX5317

## 2015-11-09 HISTORY — PX: CYSTOSCOPY WITH STENT PLACEMENT: SHX5790

## 2015-11-09 HISTORY — PX: HOLMIUM LASER APPLICATION: SHX5852

## 2015-11-09 SURGERY — CYSTOSCOPY, WITH CALCULUS REMOVAL USING BASKET
Anesthesia: General | Laterality: Right

## 2015-11-09 MED ORDER — ATROPINE SULFATE 0.4 MG/ML IJ SOLN
INTRAMUSCULAR | Status: AC
  Filled 2015-11-09: qty 1

## 2015-11-09 MED ORDER — OXYCODONE-ACETAMINOPHEN 10-325 MG PO TABS: 1.0000 | ORAL_TABLET | Freq: Four times a day (QID) | ORAL | 0 refills | Status: DC | PRN

## 2015-11-09 MED ORDER — ONDANSETRON HCL 4 MG/2ML IJ SOLN
INTRAMUSCULAR | Status: DC | PRN
  Administered 2015-11-09: 4 mg via INTRAVENOUS

## 2015-11-09 MED ORDER — SEVOFLURANE IN SOLN
RESPIRATORY_TRACT | Status: AC
  Filled 2015-11-09: qty 250

## 2015-11-09 MED ORDER — ONDANSETRON HCL 4 MG/2ML IJ SOLN
INTRAMUSCULAR | Status: AC
  Filled 2015-11-09: qty 2

## 2015-11-09 MED ORDER — DIATRIZOATE MEGLUMINE 30 % UR SOLN
URETHRAL | Status: AC
  Filled 2015-11-09: qty 300

## 2015-11-09 MED ORDER — CIPROFLOXACIN IN D5W 400 MG/200ML IV SOLN
INTRAVENOUS | Status: AC
  Filled 2015-11-09: qty 200

## 2015-11-09 MED ORDER — CEFAZOLIN IN D5W 1 GM/50ML IV SOLN
INTRAVENOUS | Status: AC
  Filled 2015-11-09: qty 50

## 2015-11-09 MED ORDER — SUCCINYLCHOLINE CHLORIDE 20 MG/ML IJ SOLN
INTRAMUSCULAR | Status: DC | PRN
  Administered 2015-11-09: 120 mg via INTRAVENOUS

## 2015-11-09 MED ORDER — CIPROFLOXACIN IN D5W 400 MG/200ML IV SOLN
INTRAVENOUS | Status: DC | PRN
Start: 2015-11-09 — End: 2015-11-09
  Administered 2015-11-09: 400 mg via INTRAVENOUS

## 2015-11-09 MED ORDER — DIATRIZOATE MEGLUMINE 30 % UR SOLN
URETHRAL | Status: DC | PRN
  Administered 2015-11-09: 100 mL via URETHRAL

## 2015-11-09 MED ORDER — CHLORHEXIDINE GLUCONATE CLOTH 2 % EX PADS
6.0000 | MEDICATED_PAD | Freq: Once | CUTANEOUS | Status: DC
Start: 2015-11-09 — End: 2015-11-09

## 2015-11-09 MED ORDER — PROPOFOL 10 MG/ML IV BOLUS
INTRAVENOUS | Status: AC
  Filled 2015-11-09: qty 20

## 2015-11-09 MED ORDER — LACTATED RINGERS IV SOLN
INTRAVENOUS | Status: DC
  Administered 2015-11-09: 12:00:00 via INTRAVENOUS

## 2015-11-09 MED ORDER — PROPOFOL 10 MG/ML IV BOLUS
INTRAVENOUS | Status: DC | PRN
  Administered 2015-11-09: 200 mg via INTRAVENOUS
  Administered 2015-11-09 (×2): 50 mg via INTRAVENOUS
  Administered 2015-11-09: 30 mg via INTRAVENOUS

## 2015-11-09 MED ORDER — TAMSULOSIN HCL 0.4 MG PO CAPS: 0.4000 mg | ORAL_CAPSULE | Freq: Every day | ORAL | 0 refills | Status: DC

## 2015-11-09 MED ORDER — FENTANYL CITRATE (PF) 250 MCG/5ML IJ SOLN
INTRAMUSCULAR | Status: AC
  Filled 2015-11-09: qty 5

## 2015-11-09 MED ORDER — ROCURONIUM BROMIDE 100 MG/10ML IV SOLN
INTRAVENOUS | Status: DC | PRN
  Administered 2015-11-09: 5 mg via INTRAVENOUS

## 2015-11-09 MED ORDER — ONDANSETRON HCL 4 MG/2ML IJ SOLN
4.0000 mg | Freq: Once | INTRAMUSCULAR | Status: AC
  Administered 2015-11-09: 4 mg via INTRAVENOUS

## 2015-11-09 MED ORDER — MIDAZOLAM HCL 2 MG/2ML IJ SOLN
1.0000 mg | INTRAMUSCULAR | Status: DC | PRN
  Administered 2015-11-09 (×3): 2 mg via INTRAVENOUS
  Filled 2015-11-09 (×2): qty 2

## 2015-11-09 MED ORDER — LIDOCAINE HCL (CARDIAC) 10 MG/ML IV SOLN
INTRAVENOUS | Status: DC | PRN
Start: 2015-11-09 — End: 2015-11-09
  Administered 2015-11-09: 50 mg via INTRAVENOUS

## 2015-11-09 MED ORDER — NALOXONE HCL 0.4 MG/ML IJ SOLN
INTRAMUSCULAR | Status: DC | PRN
  Administered 2015-11-09 (×3): 0.1 mg via INTRAVENOUS

## 2015-11-09 MED ORDER — MIDAZOLAM HCL 2 MG/2ML IJ SOLN
INTRAMUSCULAR | Status: AC
  Filled 2015-11-09: qty 2

## 2015-11-09 MED ORDER — CHLORHEXIDINE GLUCONATE CLOTH 2 % EX PADS: 6.0000 | MEDICATED_PAD | Freq: Once | CUTANEOUS | Status: DC

## 2015-11-09 MED ORDER — FENTANYL CITRATE (PF) 100 MCG/2ML IJ SOLN
INTRAMUSCULAR | Status: DC | PRN
Start: 2015-11-09 — End: 2015-11-09
  Administered 2015-11-09 (×2): 50 ug via INTRAVENOUS

## 2015-11-09 MED ORDER — PROMETHAZINE HCL 25 MG PO TABS: 25.0000 mg | ORAL_TABLET | Freq: Four times a day (QID) | ORAL | 0 refills | Status: DC | PRN

## 2015-11-09 MED ORDER — SODIUM CHLORIDE 0.9 % IR SOLN
Status: DC | PRN
  Administered 2015-11-09 (×2): 3000 mL via INTRAVESICAL

## 2015-11-09 MED ORDER — FENTANYL CITRATE (PF) 100 MCG/2ML IJ SOLN
INTRAMUSCULAR | Status: AC
  Filled 2015-11-09: qty 2

## 2015-11-09 MED ORDER — FENTANYL CITRATE (PF) 100 MCG/2ML IJ SOLN
25.0000 ug | INTRAMUSCULAR | Status: DC | PRN
  Administered 2015-11-09 (×2): 25 ug via INTRAVENOUS

## 2015-11-09 SURGICAL SUPPLY — 24 items
BAG DRAIN URO TABLE W/ADPT NS (DRAPE) ×3 IMPLANT
BAG DRN 8 ADPR NS SKTRN CSTL (DRAPE) ×1
BAG HAMPER (MISCELLANEOUS) ×3 IMPLANT
CATH INTERMIT  6FR 70CM (CATHETERS) IMPLANT
CLOTH BEACON ORANGE TIMEOUT ST (SAFETY) ×3 IMPLANT
EXTRACTOR STONE NITINOL NGAGE (UROLOGICAL SUPPLIES) ×3 IMPLANT
FIBER LASER FLEXIVA 200 (UROLOGICAL SUPPLIES) ×5 IMPLANT
FIBER LASER FLEXIVA 365 (UROLOGICAL SUPPLIES) IMPLANT
GLOVE BIO SURGEON STRL SZ8 (GLOVE) ×3 IMPLANT
GLOVE BIOGEL PI IND STRL 7.0 (GLOVE) IMPLANT
GLOVE BIOGEL PI INDICATOR 7.0 (GLOVE) ×4
GLOVE EXAM NITRILE PF MED BLUE (GLOVE) ×4 IMPLANT
GOWN STRL REUS W/ TWL LRG LVL3 (GOWN DISPOSABLE) ×1 IMPLANT
GOWN STRL REUS W/ TWL XL LVL3 (GOWN DISPOSABLE) ×1 IMPLANT
GOWN STRL REUS W/TWL LRG LVL3 (GOWN DISPOSABLE) ×3
GOWN STRL REUS W/TWL XL LVL3 (GOWN DISPOSABLE) ×3
GUIDEWIRE ANG ZIPWIRE 038X150 (WIRE) ×3 IMPLANT
GUIDEWIRE STR DUAL SENSOR (WIRE) IMPLANT
IV NS IRRIG 3000ML ARTHROMATIC (IV SOLUTION) ×6 IMPLANT
MANIFOLD NEPTUNE II (INSTRUMENTS) ×2 IMPLANT
PACK CYSTO (CUSTOM PROCEDURE TRAY) ×3 IMPLANT
PAD ARMBOARD 7.5X6 YLW CONV (MISCELLANEOUS) ×3 IMPLANT
STENT URET 6FRX26 CONTOUR (STENTS) IMPLANT
SYRINGE 10CC LL (SYRINGE) ×3 IMPLANT

## 2015-11-09 NOTE — H&P (Signed)
Urology Admission H&P  Chief Complaint: right flank pain  History of Present Illness: Mr Brailsford is a 45yo with a hx of nephrolithiasis who developed severe right flank pain. He was diagnosed with a 78m right UVJ calculus. He has failed MET.  Past Medical History:  Diagnosis Date  . Cancer ([facility]    testicular cancer in 2007  . GERD (gastroesophageal reflux disease)   . History of traumatic head injury   . Hypertension   . Seizures ([facility]    last seizure was 2 months ago; states are caused by head trauma in past; and no meds worked and so on no meds now.   Past Surgical History:  Procedure Laterality Date  . APPENDECTOMY  2013 ruptured  . CYSTOSCOPY W/ URETERAL STENT PLACEMENT Left 04/28/2014   Procedure: CYSTOSCOPY WITH LEFT  RETROGRADE PYELOGRAM LEFT URETERAL STENT PLACEMENT;  Surgeon: JIrine Seal MD;  Location: [facility];  Service: Urology;  Laterality: Left;  . CYSTOSCOPY W/ URETERAL STENT REMOVAL Left 05/12/2014   Procedure: CYSTOSCOPY WITH LEFT URETERAL STENT REMOVAL;  Surgeon: JIrine Seal MD;  Location: [facility];  Service: Urology;  Laterality: Left;  . CYSTOSCOPY WITH URETEROSCOPY Left 05/12/2014   Procedure: LEFT URETEROSCOPY WITH STONE EXTRACTION;  Surgeon: JIrine Seal MD;  Location: [facility];  Service: Urology;  Laterality: Left;  . ESOPHAGOGASTRODUODENOSCOPY N/A 12/16/2012   Procedure: ESOPHAGOGASTRODUODENOSCOPY (EGD);  Surgeon: NRogene Houston MD;  Location: [facility];  Service: Endoscopy;  Laterality: N/A;  255  . HOLMIUM LASER APPLICATION Left 33/71/0626  Procedure: HOLMIUM LASER APPLICATION;  Surgeon: JIrine Seal MD;  Location: [facility];  Service: Urology;  Laterality: Left;  . Right testical removed  2007  . URETEROSCOPY Left 04/28/2014   Procedure: URETEROSCOPY;  Surgeon: JIrine Seal MD;  Location: [facility];  Service: Urology;  Laterality: Left;    Home Medications:  Prescriptions Prior to Admission  Medication Sig Dispense Refill Last Dose  . oxyCODONE-acetaminophen (PERCOCET)  5-325 MG tablet Take 1-2 tablets by mouth every 6 (six) hours as needed. 15 tablet 0 11/09/2015 at 0700  . promethazine (PHENERGAN) 25 MG tablet Take 1 tablet (25 mg total) by mouth every 6 (six) hours as needed for nausea or vomiting. 30 tablet 0 11/09/2015 at 0700  . tamsulosin (FLOMAX) 0.4 MG CAPS capsule Take 1 capsule (0.4 mg total) by mouth daily. 30 capsule 0    Allergies:  Allergies  Allergen Reactions  . Penicillins Rash    Has patient had a PCN reaction causing immediate rash, facial/tongue/throat swelling, SOB or lightheadedness with hypotension: Yes Has patient had a PCN reaction causing severe rash involving mucus membranes or skin necrosis: No Has patient had a PCN reaction that required hospitalization No Has patient had a PCN reaction occurring within the last 10 years: No If all of the above answers are "NO", then may proceed with Cephalosporin use.   .Marland KitchenMorphine And Related Other (See Comments)    Severe nightmares    History reviewed. No pertinent family history. Social History:  reports that he quit smoking about 19 years ago. His smoking use included Cigarettes. He has a 1.00 pack-year smoking history. He has never used smokeless tobacco. He reports that he uses drugs, including Marijuana, about 1 time per week. He reports that he does not drink alcohol.  Review of Systems  Genitourinary: Positive for dysuria, flank pain, frequency, hematuria and urgency.  All other systems reviewed and are negative.   Physical Exam:  Vital signs in last 24  hours: Temp:  [97.7 F (36.5 C)-98.3 F (36.8 C)] 97.7 F (36.5 C) (09/29 1151) Pulse Rate:  [81] 81 (09/28 1320) Resp:  [10-18] 10 (09/29 1240) BP: (119-153)/(88-98) 123/89 (09/29 1240) SpO2:  [94 %-98 %] 98 % (09/29 1240) Weight:  [82.1 kg (181 lb)] 82.1 kg (181 lb) (09/28 1320) Physical Exam  Constitutional: He is oriented to person, place, and time. He appears well-developed and well-nourished.  HENT:  Head:  Normocephalic and atraumatic.  Eyes: EOM are normal. Pupils are equal, round, and reactive to light.  Neck: Normal range of motion. No thyromegaly present.  Cardiovascular: Normal rate and regular rhythm.   Respiratory: Effort normal. No respiratory distress.  GI: Soft. He exhibits no distension.  Musculoskeletal: Normal range of motion. He exhibits no edema.  Neurological: He is alert and oriented to person, place, and time.  Skin: Skin is warm and dry.  Psychiatric: He has a normal mood and affect. His behavior is normal. Judgment and thought content normal.    Laboratory Data:  No results found for this or any previous visit (from the past 24 hour(s)). No results found for this or any previous visit (from the past 240 hour(s)). Creatinine:  Recent Labs  11/06/15 1434  CREATININE 1.14   Baseline Creatinine: 1  Impression/Assessment:  45yo with right ureteral calculus  Plan:  The risks/benefits/alternativges to right ureteroscopic stone extraction was explained to the patient and he understands and wishes to proceed with surgery  Nicolette Bang 11/09/2015, 1:03 PM

## 2015-11-09 NOTE — Discharge Instructions (Signed)
Ureteral Stent Implantation, Care After Refer to this sheet in the next few weeks. These instructions provide you with information on caring for yourself after your procedure. Your health care provider may also give you more specific instructions. Your treatment has been planned according to current medical practices, but problems sometimes occur. Call your health care provider if you have any problems or questions after your procedure. WHAT TO EXPECT AFTER THE PROCEDURE You should be back to normal activity within 48 hours after the procedure. Nausea and vomiting may occur and are commonly the result of anesthesia. It is common to experience sharp pain in the back or lower abdomen and penis with voiding. This is caused by movement of the ends of the stent with the act of urinating.It usually goes away within minutes after you have stopped urinating. HOME CARE INSTRUCTIONS Make sure to drink plenty of fluids. You may have small amounts of bleeding, causing your urine to be red. This is normal. Certain movements may trigger pain or a feeling that you need to urinate. You may be given medicines to prevent infection or bladder spasms. Be sure to take all medicines as directed. Only take over-the-counter or prescription medicines for pain, discomfort, or fever as directed by your health care provider. Do not take aspirin, as this can make bleeding worse. Your stent will be left in until the blockage is resolved. This may take 2 weeks or longer, depending on the reason for stent implantation. You may have an X-ray exam to make sure your ureter is open and that the stent has not moved out of position (migrated). The stent can be removed by your health care provider in the office. Medicines may be given for comfort while the stent is being removed. Be sure to keep all follow-up appointments so your health care provider can check that you are healing properly. SEEK MEDICAL CARE IF: You experience increasing  pain. Your pain medicine is not working. SEEK IMMEDIATE MEDICAL CARE IF: Your urine is dark red or has blood clots. You are leaking urine (incontinent). You have a fever, chills, feeling sick to your stomach (nausea), or vomiting. Your pain is not relieved by pain medicine. The end of the stent comes out of the urethra. You are unable to urinate.   This information is not intended to replace advice given to you by your health care provider. Make sure you discuss any questions you have with your health care provider.   Document Released: 09/29/2012 Document Revised: 02/01/2013 Document Reviewed: 08/11/2014 Elsevier Interactive Patient Education 2016 [place].   Cystoscopy, Care After Refer to this sheet in the next few weeks. These instructions provide you with information on caring for yourself after your procedure. Your caregiver may also give you more specific instructions. Your treatment has been planned according to current medical practices, but problems sometimes occur. Call your caregiver if you have any problems or questions after your procedure. HOME CARE INSTRUCTIONS  Things you can do to ease any discomfort after your procedure include:  Drinking enough water and fluids to keep your urine clear or pale yellow.  Taking a warm bath to relieve any burning feelings. SEEK IMMEDIATE MEDICAL CARE IF:   You have an increase in blood in your urine.  You notice blood clots in your urine.  You have difficulty passing urine.  You have the chills.  You have abdominal pain.  You have a fever or persistent symptoms for more than 2-3 days.  You have a fever  and your symptoms suddenly get worse. MAKE SURE YOU:   Understand these instructions.  Will watch your condition.  Will get help right away if you are not doing well or get worse.   This information is not intended to replace advice given to you by your health care provider. Make sure you discuss any questions you  have with your health care provider.   Document Released: 08/16/2004 Document Revised: 02/17/2014 Document Reviewed: 07/21/2011 Elsevier Interactive Patient Education 2016 [employer]   PATIENT INSTRUCTIONS POST-ANESTHESIA  IMMEDIATELY FOLLOWING SURGERY:  Do not drive or operate machinery for the first twenty four hours after surgery.  Do not make any important decisions for twenty four hours after surgery or while taking narcotic pain medications or sedatives.  If you develop intractable nausea and vomiting or a severe headache please notify your doctor immediately.  FOLLOW-UP:  Please make an appointment with your surgeon as instructed. You do not need to follow up with anesthesia unless specifically instructed to do so.  WOUND CARE INSTRUCTIONS (if applicable):  Keep a dry clean dressing on the anesthesia/puncture wound site if there is drainage.  Once the wound has quit draining you may leave it open to air.  Generally you should leave the bandage intact for twenty four hours unless there is drainage.  If the epidural site drains for more than 36-48 hours please call the anesthesia department.  QUESTIONS?:  Please feel free to call your physician or the hospital operator if you have any questions, and they will be happy to assist you.

## 2015-11-09 NOTE — Anesthesia Preprocedure Evaluation (Signed)
Anesthesia Evaluation  Patient identified by MRN, date of birth, ID band Patient awake    Reviewed: Allergy & Precautions, NPO status , Patient's Chart, lab work & pertinent test results  Airway Mallampati: I  TM Distance: >3 FB     Dental  (+) Poor Dentition, Missing, Loose, Dental Advisory Given,    Pulmonary sleep apnea , former smoker,  breath sounds clear to auscultation        Cardiovascular hypertension, Rhythm:Regular Rate:Normal     Neuro/Psych Seizures -, Poorly Controlled,  PSYCHIATRIC DISORDERS Depression    GI/Hepatic PUD, GERD-  Medicated and Controlled,(+)     substance abuse  marijuana use,   Endo/Other    Renal/GU      Musculoskeletal   Abdominal   Peds  Hematology   Anesthesia Other Findings Chronic narcotic + benzodiazapine    Reproductive/Obstetrics                             Anesthesia Physical Anesthesia Plan  ASA: III  Anesthesia Plan: General   Post-op Pain Management:    Induction: Intravenous, Rapid sequence and Cricoid pressure planned  Airway Management Planned: Oral ETT  Additional Equipment:   Intra-op Plan:   Post-operative Plan: Extubation in OR  Informed Consent: I have reviewed the patients History and Physical, chart, labs and discussed the procedure including the risks, benefits and alternatives for the proposed anesthesia with the patient or authorized representative who has indicated his/her understanding and acceptance.     Plan Discussed with:   Anesthesia Plan Comments:         Anesthesia Quick Evaluation  

## 2015-11-09 NOTE — Transfer of Care (Signed)
Immediate Anesthesia Transfer of Care Note  Patient: Larry Hoover  Procedure(s) Performed: Procedure(s): CYSTOSCOPY/RETROGRADE/STONE EXTRACTION WITH BASKET (Right) CYSTOSCOPY WITH STENT PLACEMENT (Right) HOLMIUM LASER APPLICATION (Right)  Patient Location: PACU  Anesthesia Type:General  Level of Consciousness: sedated and patient cooperative  Airway & Oxygen Therapy: Patient Spontanous Breathing and non-rebreather face mask  Post-op Assessment: Report given to RN and Post -op Vital signs reviewed and stable  Post vital signs: Reviewed  Last Vitals:  Vitals:   11/09/15 1320 11/09/15 1325  BP: 119/84 116/82  Resp: 10 (!) 9  Temp:      Last Pain:  Vitals:   11/09/15 1151  TempSrc: Oral  PainSc: 5       Patients Stated Pain Goal: 8 ([id number])  Complications: No apparent anesthesia complications

## 2015-11-09 NOTE — Anesthesia Procedure Notes (Signed)
Procedure Name: Intubation Date/Time: 11/09/2015 1:39 PM Performed by: Vista Deck Pre-anesthesia Checklist: Patient identified, Patient being monitored, Timeout performed, Emergency Drugs available and Suction available Patient Re-evaluated:Patient Re-evaluated prior to inductionOxygen Delivery Method: Circle System Utilized Preoxygenation: Pre-oxygenation with 100% oxygen Intubation Type: IV induction, Rapid sequence and Cricoid Pressure applied Laryngoscope Size: Miller and 2 Grade View: Grade I Tube type: Oral Tube size: 7.0 mm Number of attempts: 1 Airway Equipment and Method: stylet Placement Confirmation: ETT inserted through vocal cords under direct vision,  positive ETCO2 and breath sounds checked- equal and bilateral Secured at: 22 cm Tube secured with: Tape Dental Injury: Teeth and Oropharynx as per pre-operative assessment

## 2015-11-09 NOTE — Brief Op Note (Signed)
11/09/2015  2:15 PM  PATIENT:  Larry Hoover  45 y.o. male  PRE-OPERATIVE DIAGNOSIS:  right ureteral calculus  POST-OPERATIVE DIAGNOSIS:  right ureteral calculus  PROCEDURE:  Procedure(s): CYSTOSCOPY/RETROGRADE/STONE EXTRACTION WITH BASKET (Right) CYSTOSCOPY WITH STENT PLACEMENT (Right) HOLMIUM LASER APPLICATION (Right)  SURGEON:  Surgeon(s) and Role:    * Cleon Gustin, MD - Primary  PHYSICIAN ASSISTANT:   ASSISTANTS: none   ANESTHESIA:   general  EBL:  Total I/O In: 1700 [I.V.:1700] Out: 0   BLOOD ADMINISTERED:none  DRAINS: right 6x26 JJ ureteral stent without tether   LOCAL MEDICATIONS USED:  NONE  SPECIMEN:  Source of Specimen:  right ureteral calculsu  DISPOSITION OF SPECIMEN:  PATHOLOGY  COUNTS:  YES  TOURNIQUET:  * No tourniquets in log *  DICTATION: .Note written in EPIC  PLAN OF CARE: Discharge to home after PACU  PATIENT DISPOSITION:  PACU - hemodynamically stable.   Delay start of Pharmacological VTE agent (>24hrs) due to surgical blood loss or risk of bleeding: not applicable

## 2015-11-09 NOTE — Anesthesia Postprocedure Evaluation (Signed)
Anesthesia Post Note  Patient: Larry Hoover  Procedure(s) Performed: Procedure(s) (LRB): CYSTOSCOPY/RETROGRADE/STONE EXTRACTION WITH BASKET (Right) CYSTOSCOPY WITH STENT PLACEMENT (Right) HOLMIUM LASER APPLICATION (Right)  Patient location during evaluation: PACU Anesthesia Type: General Level of consciousness: awake Pain management: satisfactory to patient Vital Signs Assessment: post-procedure vital signs reviewed and stable Respiratory status: spontaneous breathing Cardiovascular status: stable Anesthetic complications: no    Last Vitals:  Vitals:   11/09/15 1439 11/09/15 1445  BP: (!) 128/98 (!) 142/106  Pulse: 95 68  Resp: (!) 22 14  Temp: 36.3 C     Last Pain:  Vitals:   11/09/15 1445  TempSrc:   PainSc: 0-No pain                 Kammy Klett

## 2015-11-11 NOTE — Op Note (Signed)
Preoperative diagnosis: Right ureteral stone  Postoperative diagnosis: Same  Procedure: 1 cystoscopy 2 right retrograde pyelography 3.  Intraoperative fluoroscopy, under one hour, with interpretation 4.  Right ureteroscopic stone manipulation with laser lithotripsy 5.  Right 6 x 26 JJ stent placement  Attending: Rosie Fate  Anesthesia: General  Estimated blood loss: None  Drains: Right 6 x 26 JJ ureteral stent without tether  Specimens: stone for anlaysis  Antibiotics: ancef  Findings: right impacted distal ureteral calculus. Severe right hydronephrosis.   Indications: Patient is a 45 year old malee with a history of ureteral stone and who has failed medical expulsive therapy.  After discussing treatment options, he decided proceed with right ureteroscopic stone manipulation.  Procedure her in detail: The patient was brought to the operating room and a brief timeout was done to ensure correct patient, correct procedure, correct site.  General anesthesia was administered patient was placed in dorsal lithotomy position.  Her genitalia was then prepped and draped in usual sterile fashion.  A rigid 47 French cystoscope was passed in the urethra and the bladder.  Bladder was inspected free masses or lesions.  the right ureteral orifices were in the normal orthotopic locations.  a 6 french ureteral catheter was then instilled into the right ureter orifice.  a gentle retrograde was obtained and findings noted above.  we then placed a zip wire through the ureteral catheter and advanced up to the renal pelvis.  we then removed the cystoscope and cannulated the right ureteral orifice with a semirigid ureteroscope.  we then encountered the stone in the distal ureter. using a 200 nm laser fiber and fragmented the stone into smaller pieces.  the pieces were then removed with a Ngage basket.  once all stone fragments were removed we then placed a 6 x 26 double-j ureteral stent over the original  zip wire.We then  Removed the wire and good coil was noted in the the renal pelvis under fluoroscopy and the bladder under direct vision.     the stone fragments were then removed from the bladder and sent for analysis.   the bladder was then drained and this concluded the procedure which was well tolerated by patient.  Complications: None  Condition: Stable, extubated, transferred to PACU  Plan: Patient is to be discharged home as to follow-up in 2 weeks for stent removal.

## 2015-11-12 ENCOUNTER — Encounter (HOSPITAL_COMMUNITY): Payer: Self-pay | Admitting: Urology

## 2015-11-14 ENCOUNTER — Ambulatory Visit: Payer: Self-pay | Admitting: Urology

## 2015-11-17 ENCOUNTER — Emergency Department (HOSPITAL_COMMUNITY)
Admission: EM | Admit: 2015-11-17 | Discharge: 2015-11-17 | Disposition: A | Discharge status: Home / Self Care | Payer: Self-pay | Attending: Emergency Medicine | Admitting: Emergency Medicine

## 2015-11-17 ENCOUNTER — Encounter (HOSPITAL_COMMUNITY): Payer: Self-pay | Admitting: Emergency Medicine

## 2015-11-17 DIAGNOSIS — I10 Essential (primary) hypertension: Secondary | ICD-10-CM | POA: Insufficient documentation

## 2015-11-17 DIAGNOSIS — Z87891 Personal history of nicotine dependence: Secondary | ICD-10-CM | POA: Insufficient documentation

## 2015-11-17 DIAGNOSIS — Z79899 Other long term (current) drug therapy: Secondary | ICD-10-CM | POA: Insufficient documentation

## 2015-11-17 DIAGNOSIS — R112 Nausea with vomiting, unspecified: Secondary | ICD-10-CM | POA: Insufficient documentation

## 2015-11-17 DIAGNOSIS — R310 Gross hematuria: Secondary | ICD-10-CM | POA: Insufficient documentation

## 2015-11-17 LAB — CBC WITH DIFFERENTIAL/PLATELET
BASOS ABS: 0 10*3/uL (ref 0.0–0.1)
BASOS PCT: 0 %
Eosinophils Absolute: 0.3 10*3/uL (ref 0.0–0.7)
Eosinophils Relative: 3 %
HEMATOCRIT: 48.3 % (ref 39.0–52.0)
HEMOGLOBIN: 17.6 g/dL — AB (ref 13.0–17.0)
LYMPHS PCT: 36 %
Lymphs Abs: 3.3 10*3/uL (ref 0.7–4.0)
MCH: 34 pg (ref 26.0–34.0)
MCHC: 36.4 g/dL — ABNORMAL HIGH (ref 30.0–36.0)
MCV: 93.2 fL (ref 78.0–100.0)
Monocytes Absolute: 0.5 10*3/uL (ref 0.1–1.0)
Monocytes Relative: 6 %
NEUTROS ABS: 5.1 10*3/uL (ref 1.7–7.7)
NEUTROS PCT: 55 %
Platelets: 223 10*3/uL (ref 150–400)
RBC: 5.18 MIL/uL (ref 4.22–5.81)
RDW: 12.1 % (ref 11.5–15.5)
WBC: 9.2 10*3/uL (ref 4.0–10.5)

## 2015-11-17 LAB — URINALYSIS, ROUTINE W REFLEX MICROSCOPIC
Bilirubin Urine: NEGATIVE
GLUCOSE, UA: NEGATIVE mg/dL
KETONES UR: NEGATIVE mg/dL
LEUKOCYTES UA: NEGATIVE
NITRITE: NEGATIVE
Specific Gravity, Urine: 1.025 (ref 1.005–1.030)
pH: 6.5 (ref 5.0–8.0)

## 2015-11-17 LAB — BASIC METABOLIC PANEL
ANION GAP: 7 (ref 5–15)
BUN: 15 mg/dL (ref 6–20)
CALCIUM: 9.2 mg/dL (ref 8.9–10.3)
CO2: 27 mmol/L (ref 22–32)
Chloride: 103 mmol/L (ref 101–111)
Creatinine, Ser: 1.31 mg/dL — ABNORMAL HIGH (ref 0.61–1.24)
Glucose, Bld: 78 mg/dL (ref 65–99)
POTASSIUM: 3.9 mmol/L (ref 3.5–5.1)
Sodium: 137 mmol/L (ref 135–145)

## 2015-11-17 LAB — URINE MICROSCOPIC-ADD ON

## 2015-11-17 MED ORDER — HYDROMORPHONE HCL 1 MG/ML IJ SOLN
1.0000 mg | Freq: Once | INTRAMUSCULAR | Status: AC
  Administered 2015-11-17: 1 mg via INTRAVENOUS
  Filled 2015-11-17: qty 1

## 2015-11-17 MED ORDER — HYDROMORPHONE HCL 4 MG PO TABS: 4.0000 mg | ORAL_TABLET | Freq: Four times a day (QID) | ORAL | 0 refills | Status: DC | PRN

## 2015-11-17 MED ORDER — ONDANSETRON HCL 4 MG/2ML IJ SOLN
4.0000 mg | Freq: Once | INTRAMUSCULAR | Status: AC
  Administered 2015-11-17: 4 mg via INTRAVENOUS
  Filled 2015-11-17: qty 2

## 2015-11-17 MED ORDER — KETOROLAC TROMETHAMINE 30 MG/ML IJ SOLN
30.0000 mg | Freq: Once | INTRAMUSCULAR | Status: AC
  Administered 2015-11-17: 30 mg via INTRAVENOUS
  Filled 2015-11-17: qty 1

## 2015-11-17 NOTE — ED Triage Notes (Signed)
Patient c/o right flank pain that radiates into right lower abd and pelvic region. Per patient has stent in right kidney for multiple kidney stones. Patient states stent was placed on 9/30 after 9.40mm stones was removed. Per patient blood and pain started yesterday and has gotten increasingly worse. Patient reports nausea and vomiting.

## 2015-11-17 NOTE — Discharge Instructions (Signed)
Follow-up with your urologist as planned Wednesday. Call your urologist if you have problems prior to that time

## 2015-11-17 NOTE — ED Provider Notes (Signed)
[facility] DEPT Provider Note   CSN: [id number] Arrival date & time: 11/17/15  [id number]  By signing my name below, I, Higinio Plan, attest that this documentation has been prepared under the direction and in the presence of Milton Ferguson, MD . Electronicaly Signed: Higinio Plan, Scribe. 11/17/2015. 10:02 AM.  History   Chief Complaint Chief Complaint  Patient presents with  . Flank Pain   The history is provided by the patient. No language interpreter was used.  Flank Pain  This is a recurrent problem. The current episode started yesterday. The problem occurs constantly. The problem has been gradually worsening. Associated symptoms include abdominal pain. Pertinent negatives include no chest pain and no headaches.   HPI Comments: Larry Hoover is a 45 y.o. male who presents to the Emergency Department complaining of gradually worsening, right flank pain that began yesterday and worsened today. Pt reports his pain radiates into the RLQ of his abdomen and pelvic region. He was seen in the ED on 9/26 for similar pain and had a stent placed on 9/30 after a 9.7 mm stone was removed. He states associated nausea, vomiting and hematuria that began yesterday.   Past Medical History:  Diagnosis Date  . Cancer [facility])    testicular cancer in 2007  . GERD (gastroesophageal reflux disease)   . History of traumatic head injury   . Hypertension   . Seizures ([place])    last seizure was 2 months ago; states are caused by head trauma in past; and no meds worked and so on no meds now.    Patient Active Problem List   Diagnosis Date Noted  . Erosive esophagitis 01/10/2013  . Nausea alone 12/06/2012  . Abdominal pain, chronic, epigastric 12/06/2012  . Opiate abuse, continuous 05/11/2012    Class: Acute  . Benzodiazepine abuse, continuous 05/11/2012  . Depressive disorder, not elsewhere classified 05/08/2012  . Polysubstance (including opioids) dependence w/o physiol dependence ([place]) 05/08/2012  . Mood  disorder in conditions classified elsewhere 05/08/2012  . Chronic pain syndrome 05/08/2012    Past Surgical History:  Procedure Laterality Date  . APPENDECTOMY  2013 ruptured  . CYSTOSCOPY W/ URETERAL STENT PLACEMENT Left 04/28/2014   Procedure: CYSTOSCOPY WITH LEFT  RETROGRADE PYELOGRAM LEFT URETERAL STENT PLACEMENT;  Surgeon: Irine Seal, MD;  Location: [facility];  Service: Urology;  Laterality: Left;  . CYSTOSCOPY W/ URETERAL STENT REMOVAL Left 05/12/2014   Procedure: CYSTOSCOPY WITH LEFT URETERAL STENT REMOVAL;  Surgeon: Irine Seal, MD;  Location: [facility];  Service: Urology;  Laterality: Left;  . CYSTOSCOPY WITH STENT PLACEMENT Right 11/09/2015   Procedure: CYSTOSCOPY WITH STENT PLACEMENT;  Surgeon: Cleon Gustin, MD;  Location: [facility];  Service: Urology;  Laterality: Right;  . CYSTOSCOPY WITH URETEROSCOPY Left 05/12/2014   Procedure: LEFT URETEROSCOPY WITH STONE EXTRACTION;  Surgeon: Irine Seal, MD;  Location: [facility];  Service: Urology;  Laterality: Left;  . CYSTOSCOPY/RETROGRADE/URETEROSCOPY/STONE EXTRACTION WITH BASKET Right 11/09/2015   Procedure: CYSTOSCOPY/RETROGRADE/STONE EXTRACTION WITH BASKET;  Surgeon: Cleon Gustin, MD;  Location: [facility];  Service: Urology;  Laterality: Right;  . ESOPHAGOGASTRODUODENOSCOPY N/A 12/16/2012   Procedure: ESOPHAGOGASTRODUODENOSCOPY (EGD);  Surgeon: Rogene Houston, MD;  Location: [facility];  Service: Endoscopy;  Laterality: N/A;  255  . HOLMIUM LASER APPLICATION Left [id number]   Procedure: HOLMIUM LASER APPLICATION;  Surgeon: Irine Seal, MD;  Location: [facility];  Service: Urology;  Laterality: Left;  . HOLMIUM LASER APPLICATION Right [id number]   Procedure: HOLMIUM LASER APPLICATION;  Surgeon: Saralyn Pilar  Alita Chyle, MD;  Location: [facility];  Service: Urology;  Laterality: Right;  . Right testical removed  2007  . URETEROSCOPY Left 04/28/2014   Procedure: URETEROSCOPY;  Surgeon: Irine Seal, MD;  Location: [facility];  Service: Urology;  Laterality: Left;     Home Medications    Prior to Admission medications   Medication Sig Start Date End Date Taking? Authorizing Provider  oxyCODONE-acetaminophen (PERCOCET) 10-325 MG tablet Take 1 tablet by mouth every 6 (six) hours as needed for pain. 11/09/15   Cleon Gustin, MD  oxyCODONE-acetaminophen (PERCOCET) 5-325 MG tablet Take 1-2 tablets by mouth every 6 (six) hours as needed. 11/06/15   Isla Pence, MD  promethazine (PHENERGAN) 25 MG tablet Take 1 tablet (25 mg total) by mouth every 6 (six) hours as needed for nausea or vomiting. 11/09/15   Cleon Gustin, MD  tamsulosin (FLOMAX) 0.4 MG CAPS capsule Take 1 capsule (0.4 mg total) by mouth daily. 11/09/15   Cleon Gustin, MD    Family History No family history on file.  Social History Social History  Substance Use Topics  . Smoking status: Former Smoker    Packs/day: 1.00    Years: 1.00    Types: Cigarettes    Quit date: 05/14/1996  . Smokeless tobacco: Never Used  . Alcohol use No     Allergies   Penicillins and Morphine and related   Review of Systems Review of Systems  Constitutional: Negative for appetite change and fatigue.  HENT: Negative for congestion, ear discharge and sinus pressure.   Eyes: Negative for discharge.  Respiratory: Negative for cough.   Cardiovascular: Negative for chest pain.  Gastrointestinal: Positive for abdominal pain, nausea and vomiting. Negative for diarrhea.  Genitourinary: Positive for flank pain and hematuria. Negative for frequency.  Musculoskeletal: Negative for back pain.  Skin: Negative for rash.  Neurological: Negative for seizures and headaches.  Psychiatric/Behavioral: Negative for hallucinations.   Physical Exam Updated Vital Signs BP (!) 137/106 (BP Location: Left Arm)   Pulse 94   Temp 98.3 F (36.8 C) (Oral)   Resp 18   Ht 5\' 10"  (1.778 m)   Wt 177 lb (80.3 kg)   SpO2 99%   BMI 25.40 kg/m   Physical Exam  Constitutional: He is oriented to person, place, and  time. He appears well-developed.  HENT:  Head: Normocephalic.  Eyes: Conjunctivae and EOM are normal. No scleral icterus.  Neck: Neck supple. No thyromegaly present.  Cardiovascular: Normal rate and regular rhythm.  Exam reveals no gallop and no friction rub.   No murmur heard. Pulmonary/Chest: No stridor. He has no wheezes. He has no rales. He exhibits no tenderness.  Abdominal: He exhibits no distension. There is tenderness. There is no rebound.  Moderate RLQ tenderness  Musculoskeletal: Normal range of motion. He exhibits no edema.  Lymphadenopathy:    He has no cervical adenopathy.  Neurological: He is oriented to person, place, and time. He exhibits normal muscle tone. Coordination normal.  Skin: No rash noted. No erythema.  Psychiatric: He has a normal mood and affect. His behavior is normal.   ED Treatments / Results  Labs (all labs ordered are listed, but only abnormal results are displayed) Labs Reviewed  URINALYSIS, ROUTINE W REFLEX MICROSCOPIC (NOT AT [facility])  BASIC METABOLIC PANEL  CBC    EKG  EKG Interpretation None       Radiology No results found.  Procedures Procedures (including critical care time)  Medications Ordered in ED Medications -  No data to display  DIAGNOSTIC STUDIES:  Oxygen Saturation is 99% on RA, normal by my interpretation.    COORDINATION OF CARE:  9:59 AM Discussed treatment plan with pt at bedside and pt agreed to plan.  Initial Impression / Assessment and Plan / ED Course  I have reviewed the triage vital signs and the nursing notes.  Pertinent labs & imaging results that were available during my care of the patient were reviewed by me and considered in my medical decision making (see chart for details).  Clinical Course    Patient with hematuria and flank pain. Patient has a stent and on the right side and is to be removed on Wednesday. I spoke with urology who agrees with Dilaudid pills for pain and they will see him on  Wednesday  I personally performed the services described in this documentation, which was scribed in my presence. The recorded information has been reviewed and is accurate.   Final Clinical Impressions(s) / ED Diagnoses   Final diagnoses:  None    New Prescriptions New Prescriptions   No medications on file  The chart was scribed for me under my direct supervision.  I personally performed the history, physical, and medical decision making and all procedures in the evaluation of this patient.Milton Ferguson, MD 11/17/15 [phone]

## 2015-11-18 LAB — URINE CULTURE: Culture: NO GROWTH

## 2015-11-21 ENCOUNTER — Ambulatory Visit (INDEPENDENT_AMBULATORY_CARE_PROVIDER_SITE_OTHER): Payer: Self-pay | Admitting: Urology

## 2015-11-21 DIAGNOSIS — N201 Calculus of ureter: Secondary | ICD-10-CM

## 2015-11-21 LAB — STONE ANALYSIS
CA OXALATE, DIHYDRATE: 65 %
CA OXALATE, MONOHYDR.: 25 %
CA PHOS CRY STONE QL IR: 10 %
Stone Weight KSTONE: 186 mg

## 2015-12-04 ENCOUNTER — Other Ambulatory Visit: Payer: Self-pay | Admitting: Urology

## 2015-12-04 DIAGNOSIS — N200 Calculus of kidney: Secondary | ICD-10-CM

## 2016-01-02 ENCOUNTER — Ambulatory Visit (HOSPITAL_COMMUNITY)
Admission: RE | Admit: 2016-01-02 | Discharge: 2016-01-02 | Disposition: A | Discharge status: Home / Self Care | Payer: Self-pay | Source: Ambulatory Visit | Attending: Urology | Admitting: Urology

## 2016-01-02 ENCOUNTER — Ambulatory Visit (INDEPENDENT_AMBULATORY_CARE_PROVIDER_SITE_OTHER): Payer: Self-pay | Admitting: Urology

## 2016-01-02 DIAGNOSIS — N200 Calculus of kidney: Secondary | ICD-10-CM | POA: Insufficient documentation

## 2016-01-02 DIAGNOSIS — N201 Calculus of ureter: Secondary | ICD-10-CM

## 2016-02-23 IMAGING — CT CT ABD-PELV W/ CM
2 of 5 series · 16 of 46 positions shown, 18 images · IV contrast (Omnipaque 300)
Comparison: Most recent CT 11/19/2013

CLINICAL DATA: Abdominal pain. Severe left lower quadrant pain for
3 days, increasing today. Associated nausea and vomiting. History of
testicular cancer.

EXAM:
CT ABDOMEN AND PELVIS WITH CONTRAST
TECHNIQUE: Multidetector CT imaging of the abdomen and pelvis was performed
using the standard protocol following bolus administration of
intravenous contrast.
CONTRAST:  50mL OMNIPAQUE IOHEXOL 300 MG/ML SOLN, 50mL OMNIPAQUE
IOHEXOL 300 MG/ML SOLN

[Series 2: abd_pel_with 5.0 b40f · axial · 0.82mm/px · z∈[-524,-84]mm · 13 of 100 slices shown, 15 images]
[im 6/100  soft-tissue]
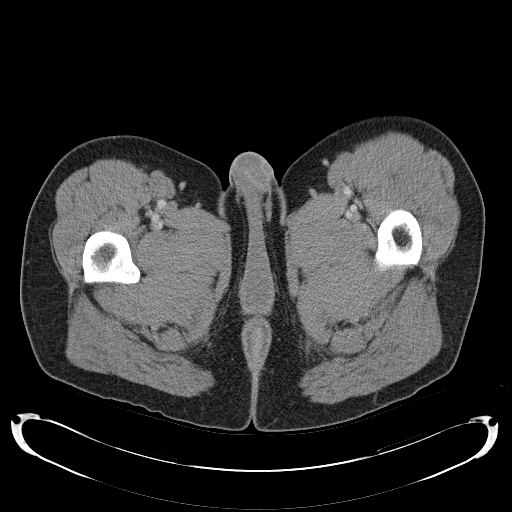
[im 6/100  bone]
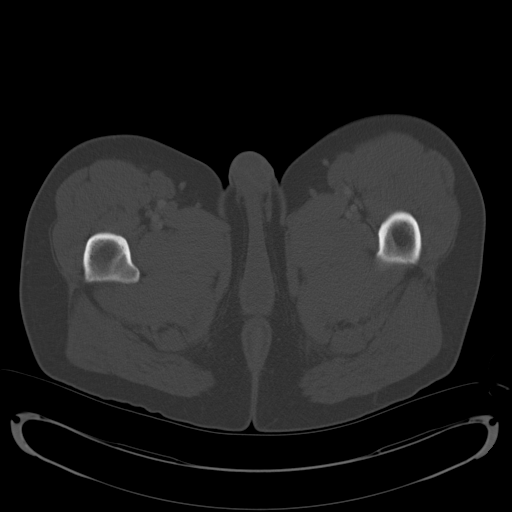
[im 12/100  soft-tissue]
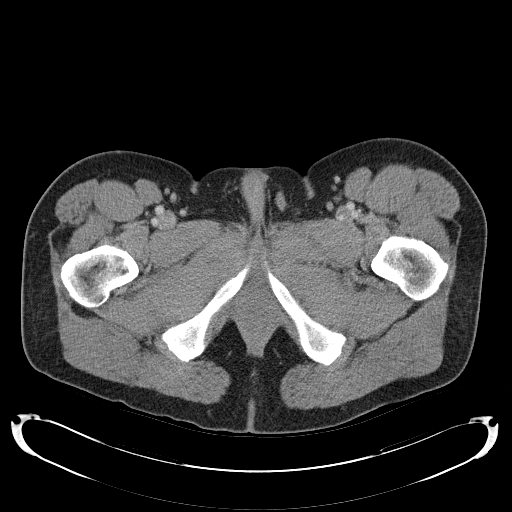
[im 24/100  soft-tissue]
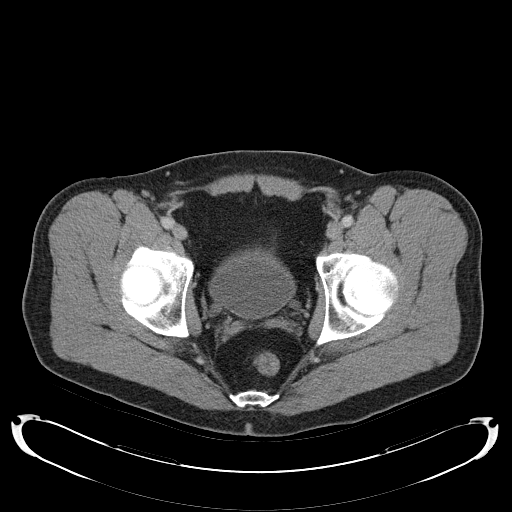
[im 30/100  soft-tissue]
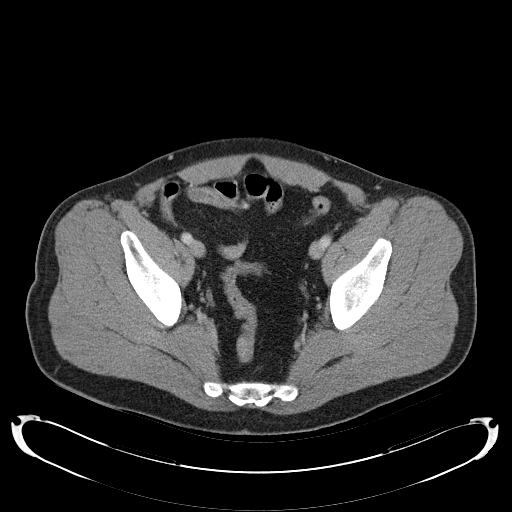
[im 35/100  soft-tissue]
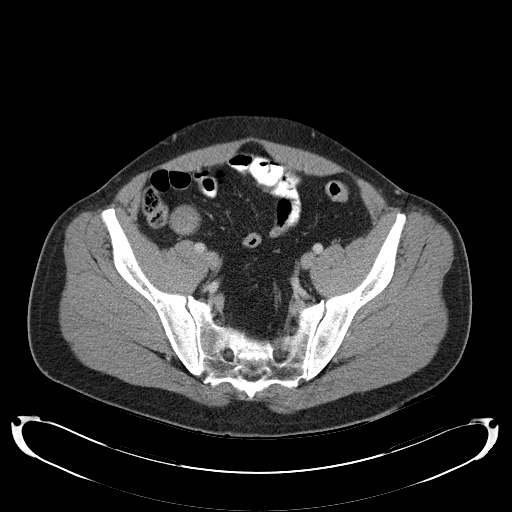
[im 41/100  soft-tissue]
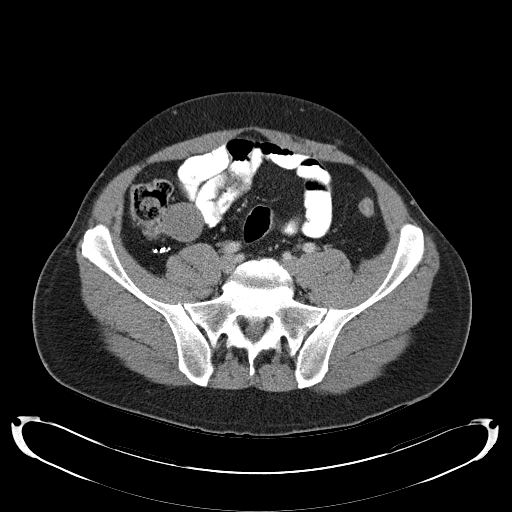
[im 53/100  soft-tissue]
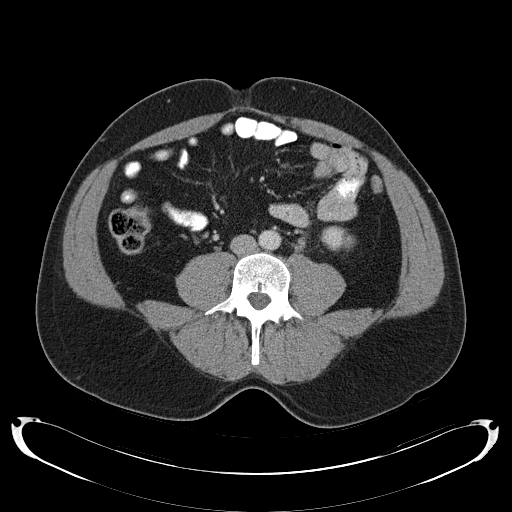
[im 59/100  soft-tissue]
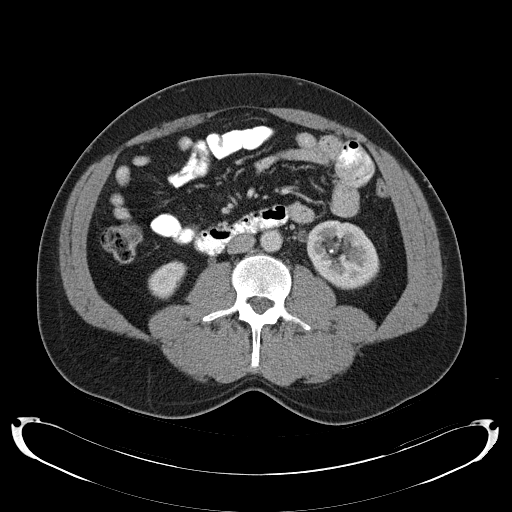
[im 65/100  soft-tissue]
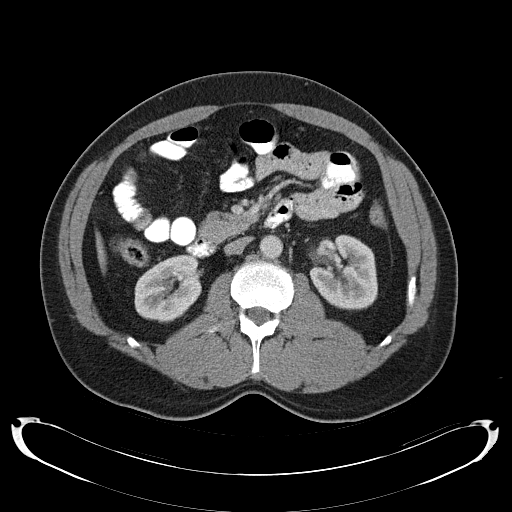
[im 65/100  bone]
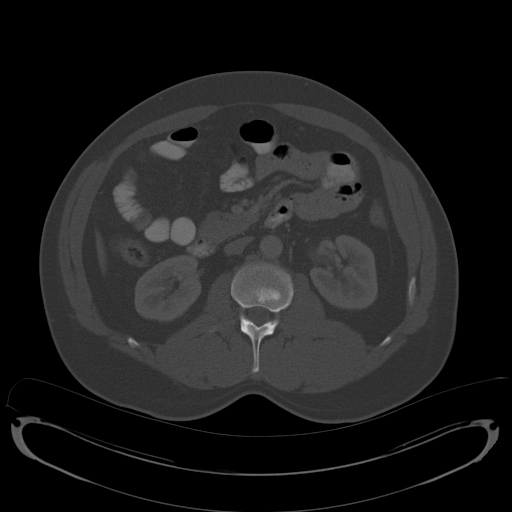
[im 70/100  soft-tissue]
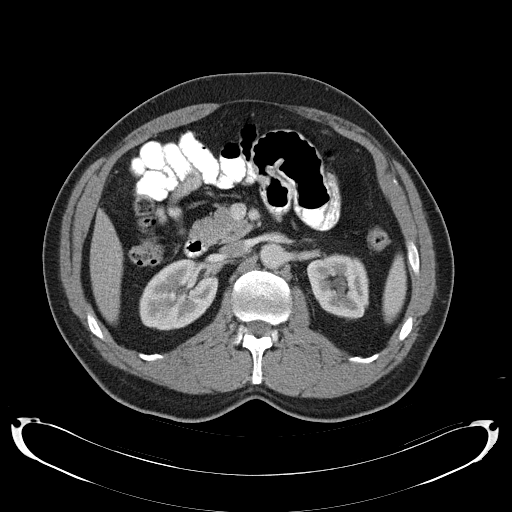
[im 76/100  soft-tissue]
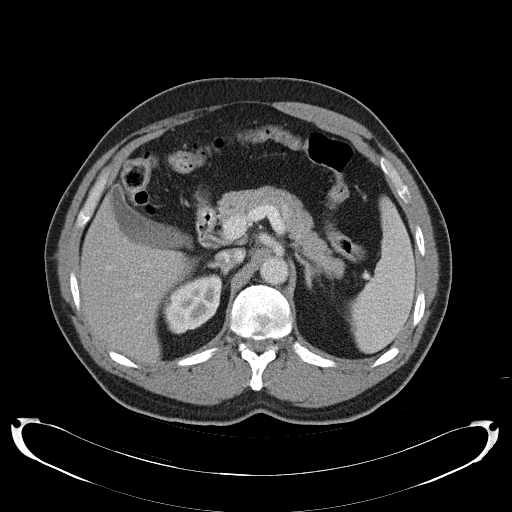
[im 88/100  soft-tissue]
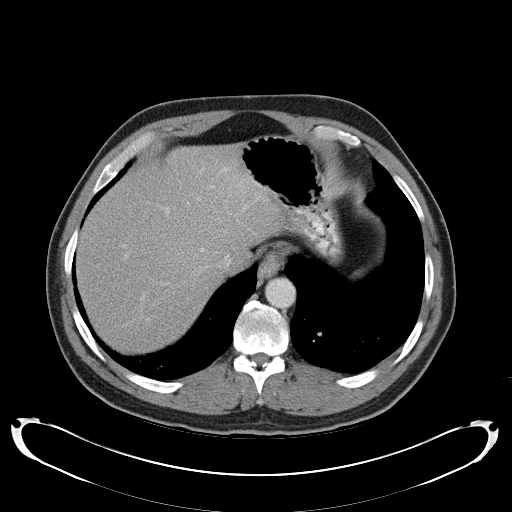
[im 94/100  soft-tissue]
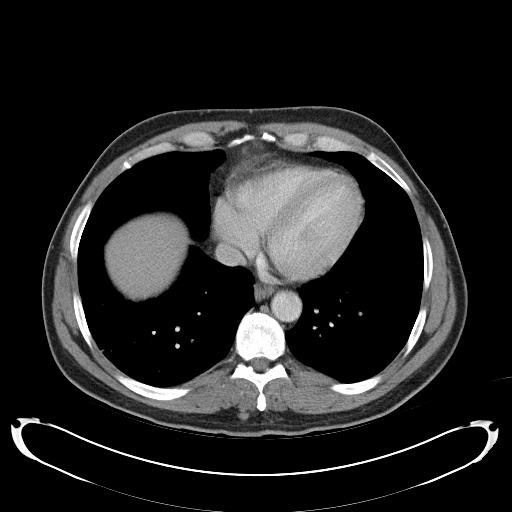

[Series 3: abd_pel_with 3.0 spo cor · coronal · 0.79mm/px · 3 of 92 slices shown]
[im 31/92  soft-tissue]
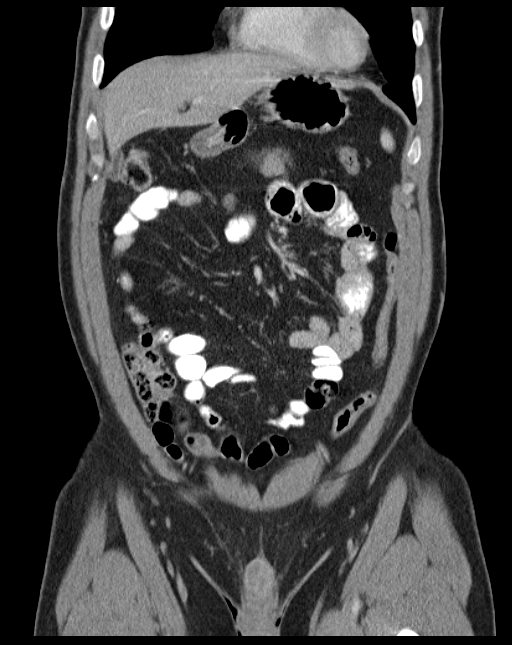
[im 41/92  soft-tissue]
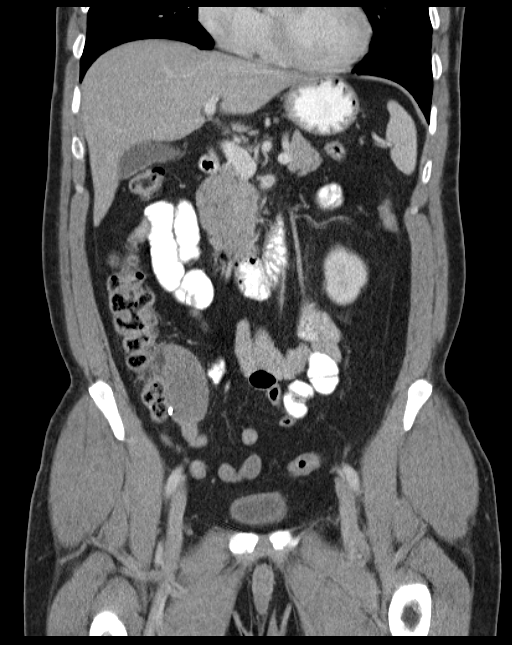
[im 51/92  soft-tissue]
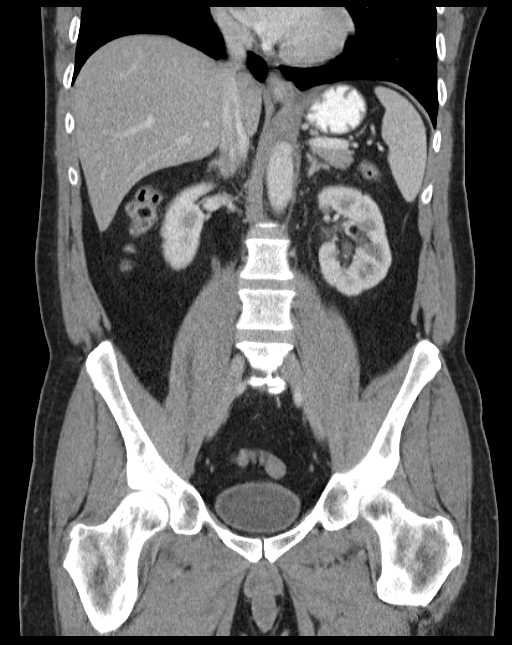

[16 of 46 positions shown; findings below may reference images not displayed]

FINDINGS: Unchanged atelectasis or scarring in the right lower lobe. No
pulmonary nodule, consolidation or pleural effusion.

There is a partially obstruct 6 x 7 mm stone in the left proximal
ureter with resultant mild hydroureteronephrosis. There is a delayed
nephrogram on the left. Excreted contrast passes distal to the stone
on delayed imaging. The ureter distal to the stone is decompressed.
Nonobstructing 4 mm stone is seen in the lower left kidney.
Nonobstructing 8 mm calculus in the upper right kidney with stable
prominence of the right renal collecting system and ureter. No right
hydronephrosis or perinephric stranding.

The liver, gallbladder, spleen, pancreas, and adrenal glands are
normal. The abdominal aorta is normal in caliber. There is no
retroperitoneal adenopathy.

Stomach is physiologically distended with oral contrast. There are
no dilated or thickened bowel loops. Post appendectomy with surgical
clips at the base of the cecum. Small volume of colonic stool. No
free air, free fluid, or intra-abdominal fluid collection.

Within the pelvis the urinary bladder is minimally distended.
Prostate gland is normal in size. No pelvic free fluid. Absence of
contents in the right inguinal canal consistent with prior right
orchiectomy.

No focal osseous lesion.  Mild degenerative change in the spine.
IMPRESSION: 1. Partially obstructing 6 x 7 mm stone in the left proximal ureter
with minimal resultant hydroureteronephrosis.
2. Nonobstructing stones in both kidneys. Stable chronic right
ureteral dilatation.

## 2016-03-01 IMAGING — DX DG ABDOMEN 1V
1 series · 1 of 1 positions shown · non-contrast
Comparison: CT abdomen and pelvis 04/21/2014.

CLINICAL DATA: Severe left-sided abdominal pain and left flank
pain. Obstructing left ureteral calculus identified 1 week ago.

EXAM:
ABDOMEN - 1 VIEW

[abdomen supine]
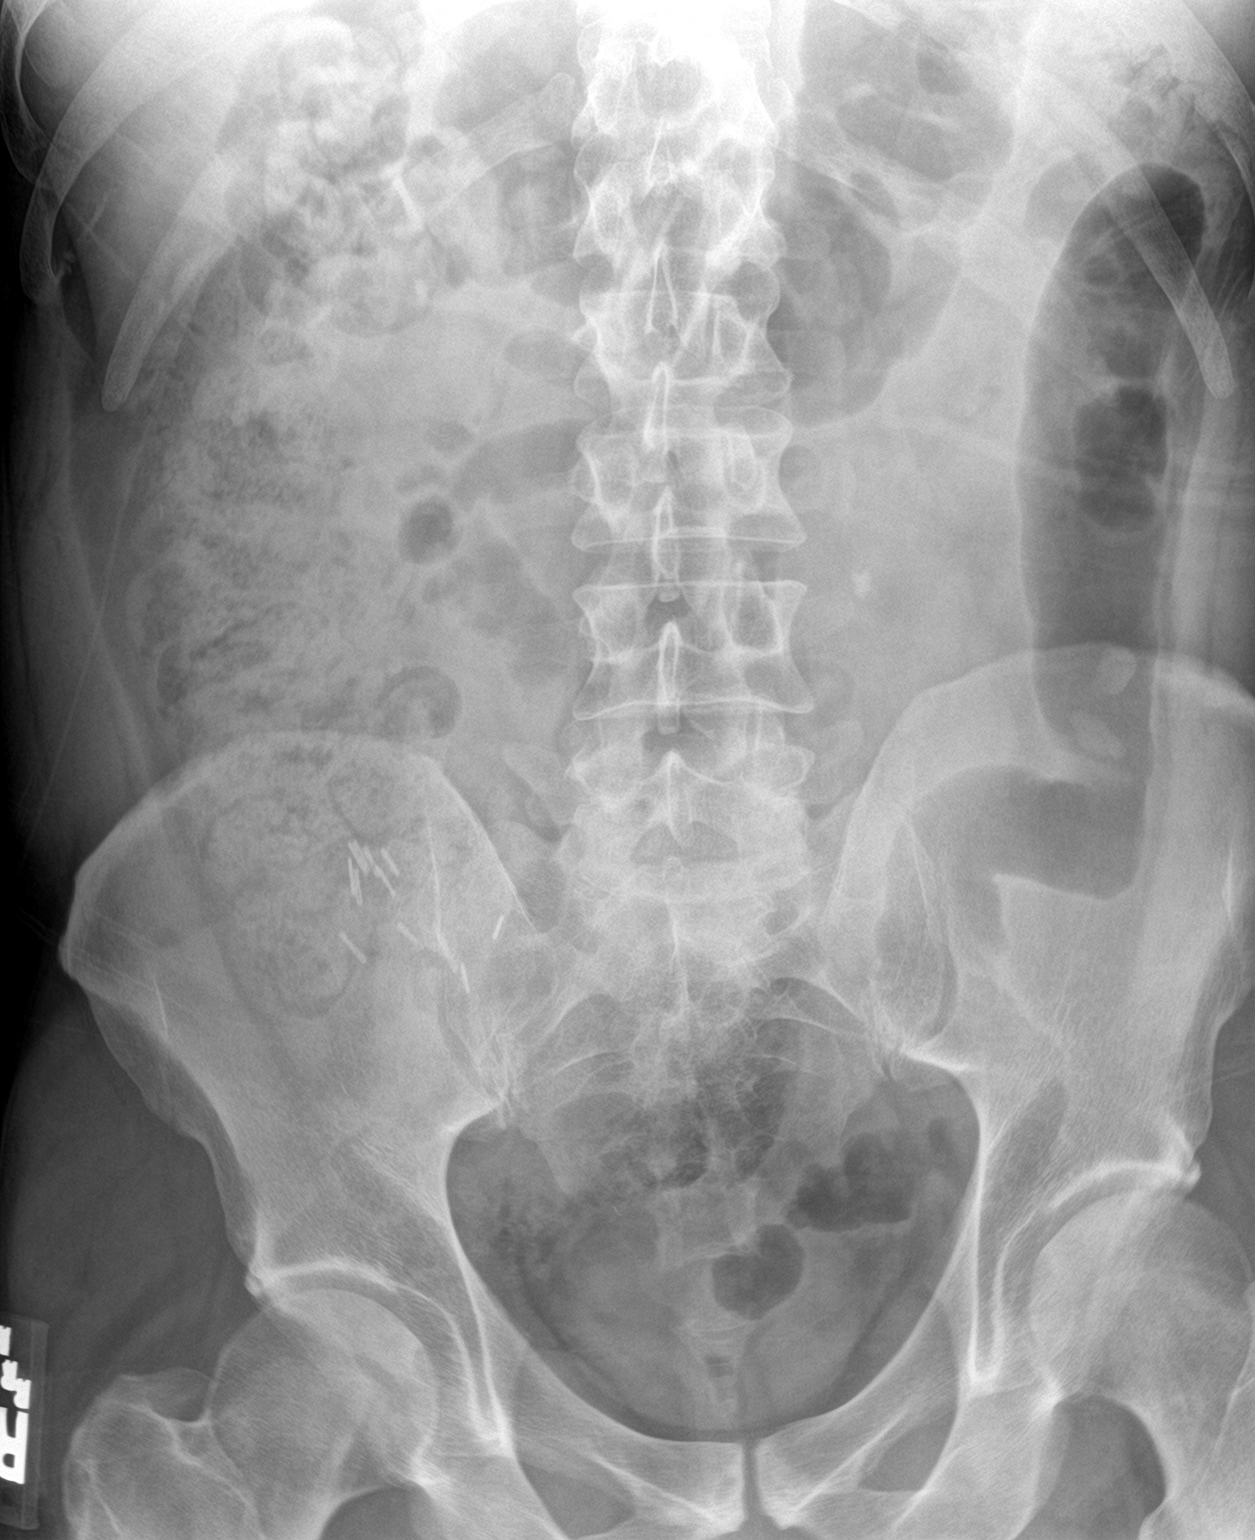

[1 of 1 positions shown; findings below may reference images not displayed]

FINDINGS: The previously identified 7 mm proximal left ureteral calculus is
unchanged in position since the CT 1 week ago, projecting adjacent
to the left transverse process of L4. The 4 mm left lower pole renal
calculus identified on CT is barely visible.

Bowel gas pattern unremarkable without evidence of obstruction or
significant ileus. Moderate stool burden in the colon. Surgical
clips in the right lower quadrant as noted on CT.
IMPRESSION: 1. 7 mm proximal left ureteral calculus unchanged in position since
the CT 1 week ago.
2. No acute abnormalities involving the abdomen.

## 2016-03-01 IMAGING — RF DG RETROGRADE PYELOGRAM
1 series · 9 of 9 positions shown · non-contrast
Comparison: 04/28/2014 and earlier studies

CLINICAL DATA: left mid ureteral calculus, Phillipa Manahan used to
break up stone, left ureteral stent placement

EXAM:
RETROGRADE PYELOGRAM

[Series 1: run · 6 acquisitions, 9 frames shown]
[im 1/6]
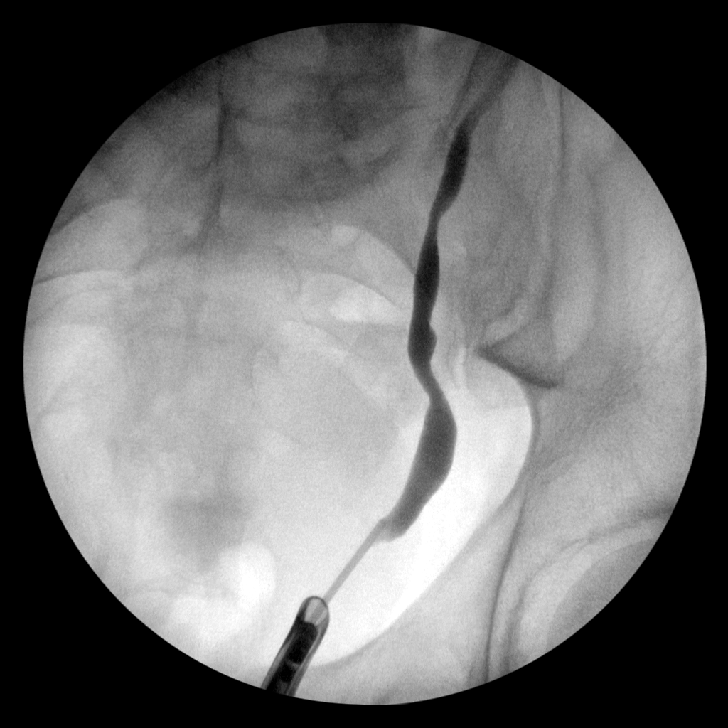
[im 1/6]
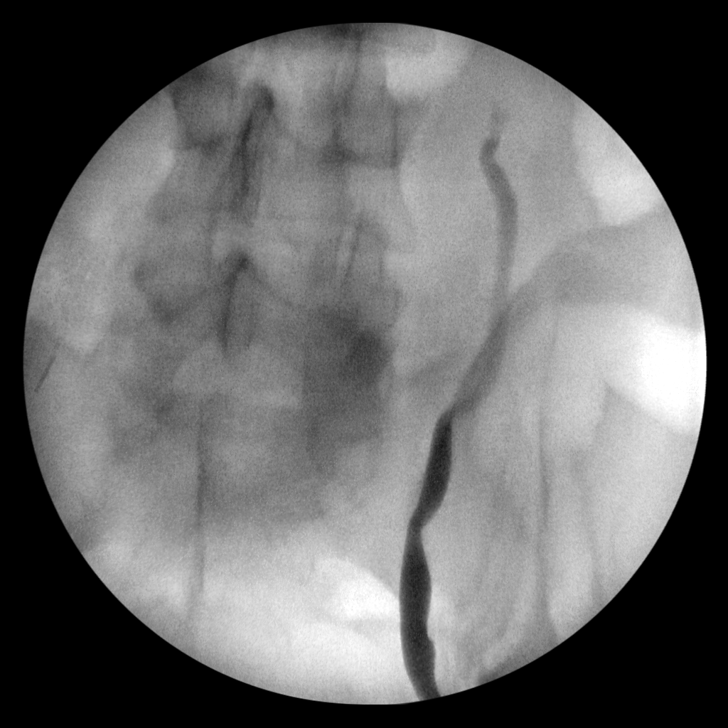
[im 1/6]
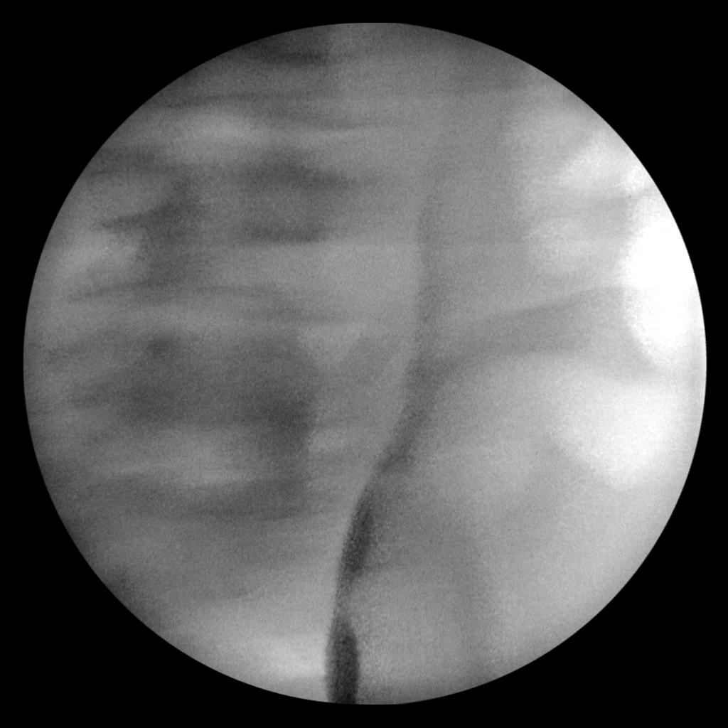
[im 1/6]
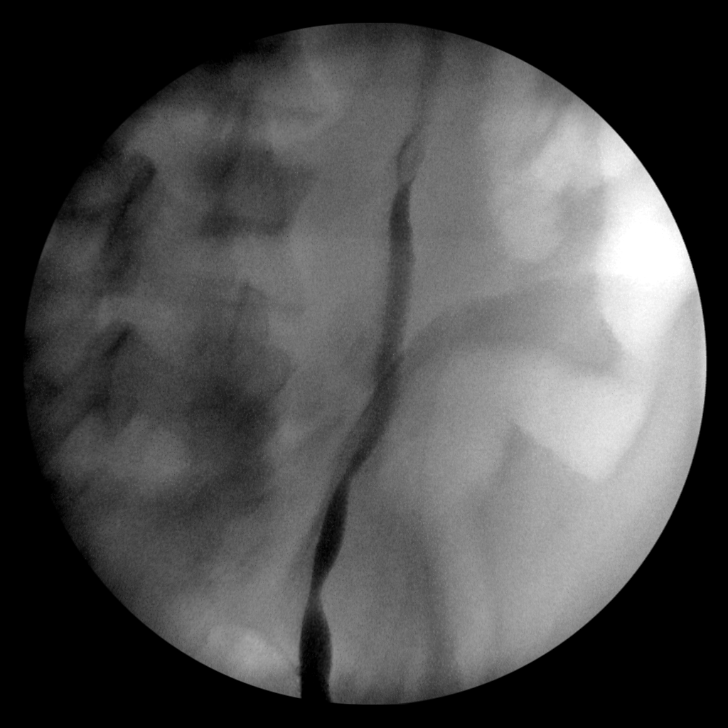
[im 2/6]
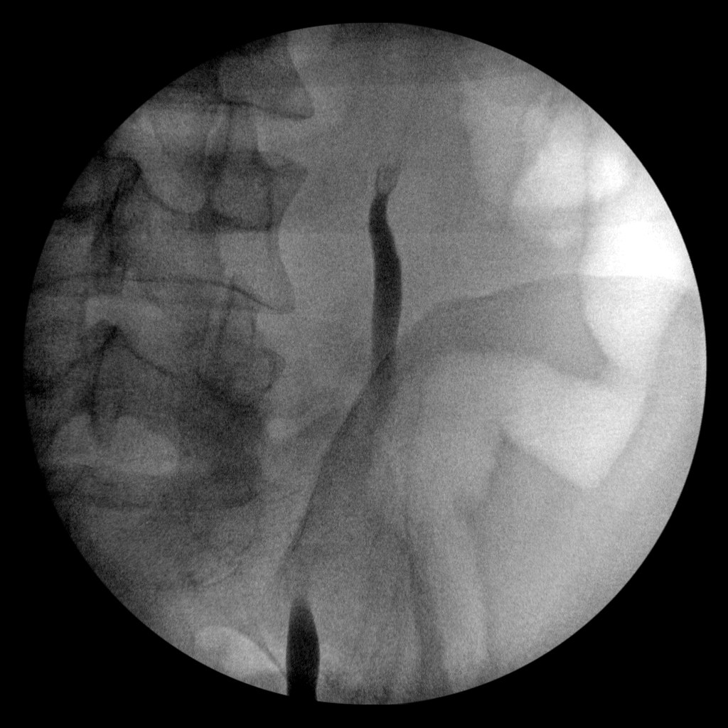
[im 3/6]
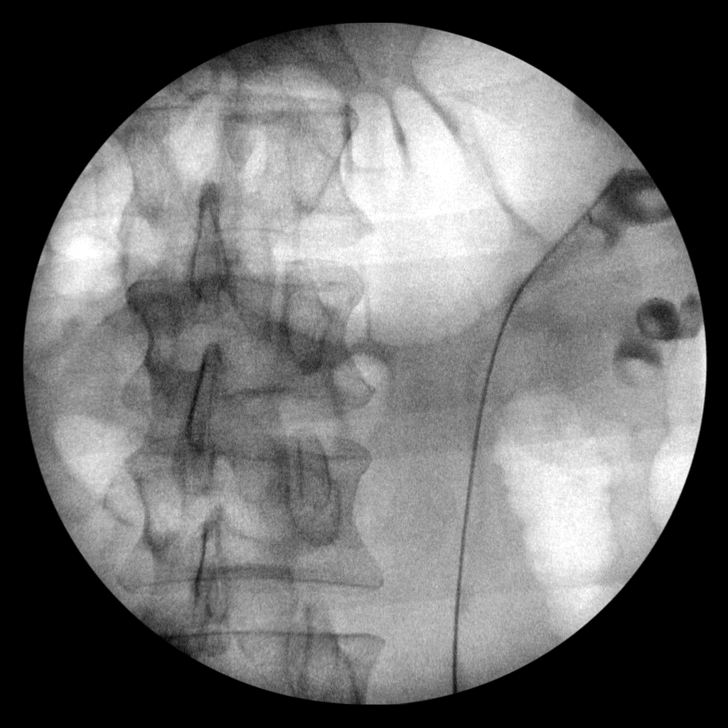
[im 4/6]
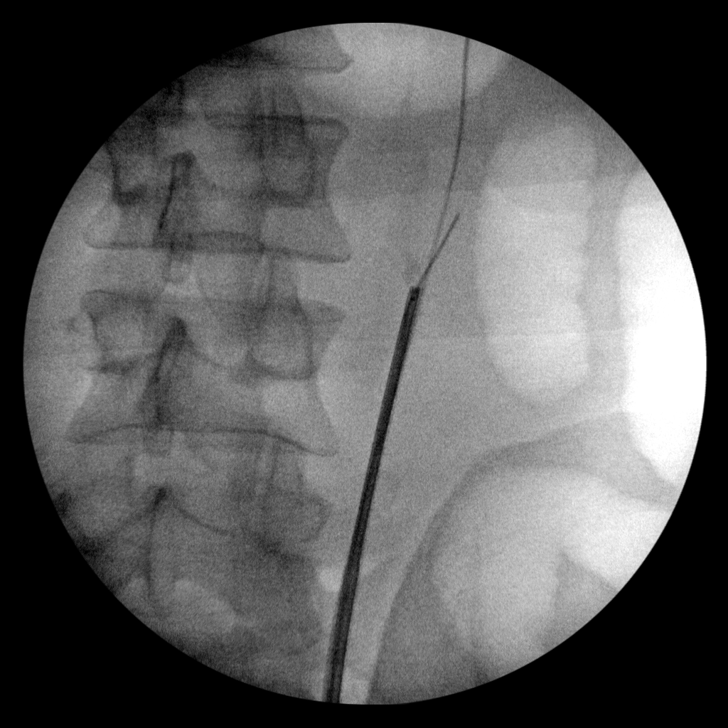
[im 5/6]
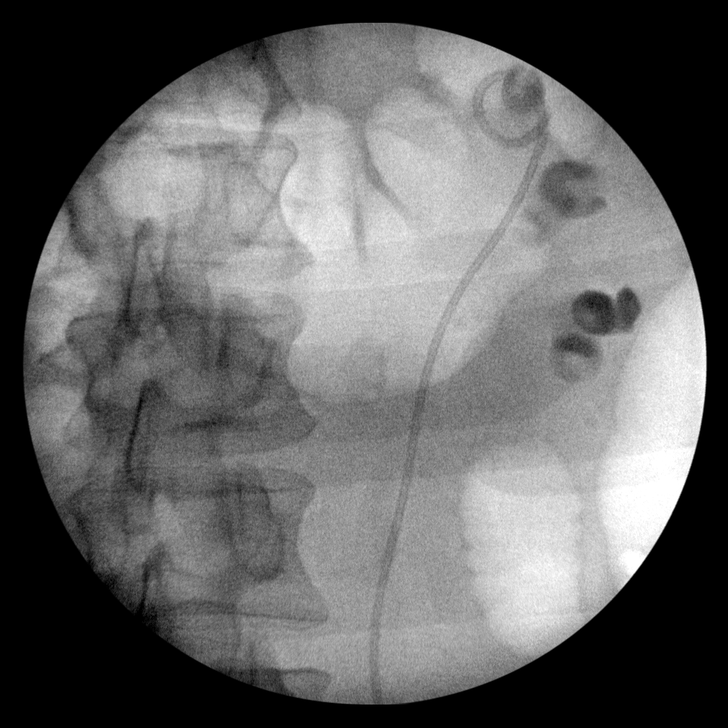
[im 6/6]
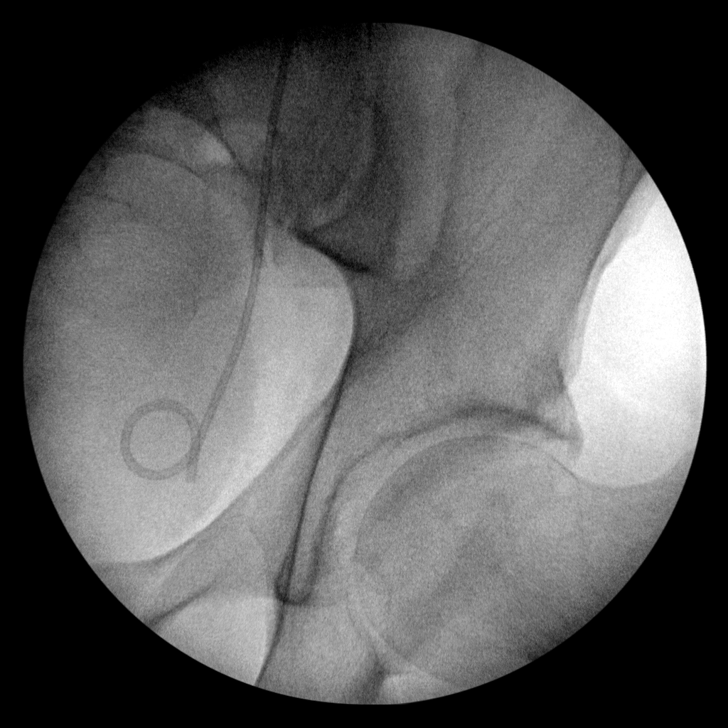

[9 of 9 positions shown; findings below may reference images not displayed]

FINDINGS: A series of 6 fluoroscopic spot images document cystoscopic
catheterization and opacification of the left renal ureter. Filling
defect is noted in the mid ureter at the level of calculus seen on
previous radiography. Subsequent images demonstrate guidewire
passage up to the renal collecting system and cystoscope advancement
to the level of the calculus. Final images document double-J
ureteral stent placement. No residual calculus is confirmed.
IMPRESSION: 1. Cystoscopic approach to left mid ureteral calculus, with double-J
ureteral stent placement.

## 2016-05-30 ENCOUNTER — Other Ambulatory Visit: Payer: Self-pay | Admitting: Urology

## 2016-05-30 DIAGNOSIS — N201 Calculus of ureter: Secondary | ICD-10-CM

## 2016-06-09 ENCOUNTER — Ambulatory Visit (HOSPITAL_COMMUNITY)
Admission: RE | Admit: 2016-06-09 | Discharge: 2016-06-09 | Disposition: A | Discharge status: Home / Self Care | Payer: Self-pay | Source: Ambulatory Visit | Attending: Urology | Admitting: Urology

## 2016-06-09 DIAGNOSIS — N201 Calculus of ureter: Secondary | ICD-10-CM | POA: Insufficient documentation

## 2016-07-09 ENCOUNTER — Ambulatory Visit: Payer: Self-pay | Admitting: Urology

## 2017-09-12 IMAGING — RF DG RETROGRADE PYELOGRAM
1 series · 4 of 4 positions shown · non-contrast
Comparison: [facility] from 11/06/2015 and CT of 10/27/2015

CLINICAL DATA: Right-sided stent placement for right ureteral stone

EXAM:
RETROGRADE PYELOGRAM
Fluoroscopic time 7 seconds.

[Series 1: run · 4 of 4 slices shown]
[im 1/4]
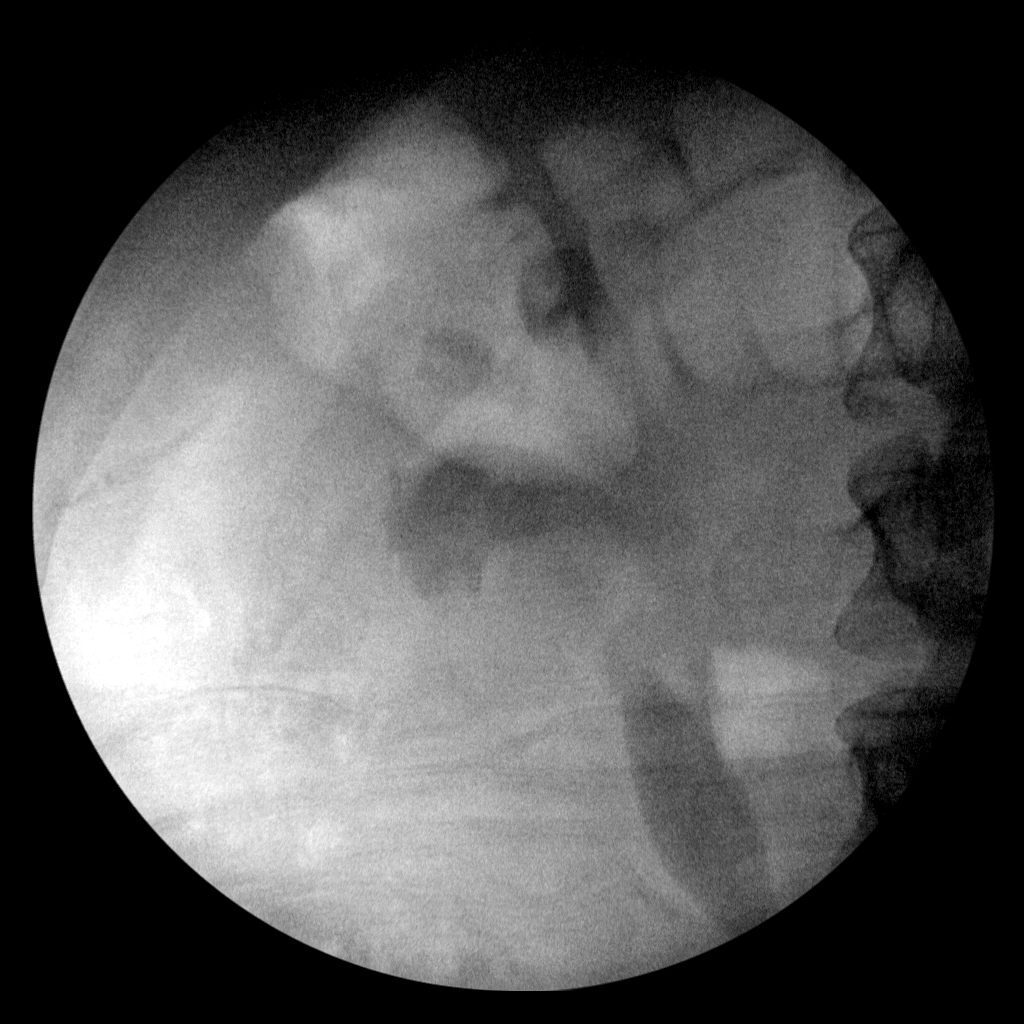
[im 2/4]
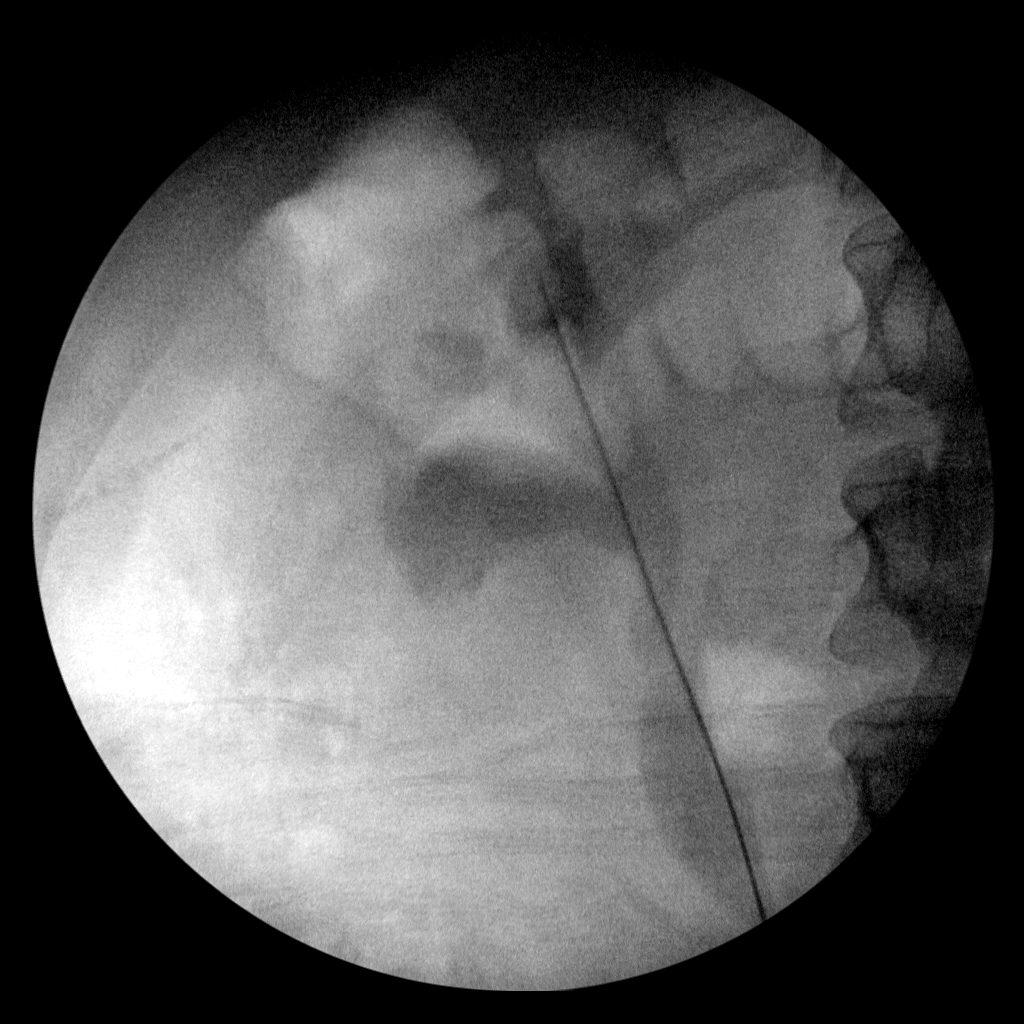
[im 3/4]
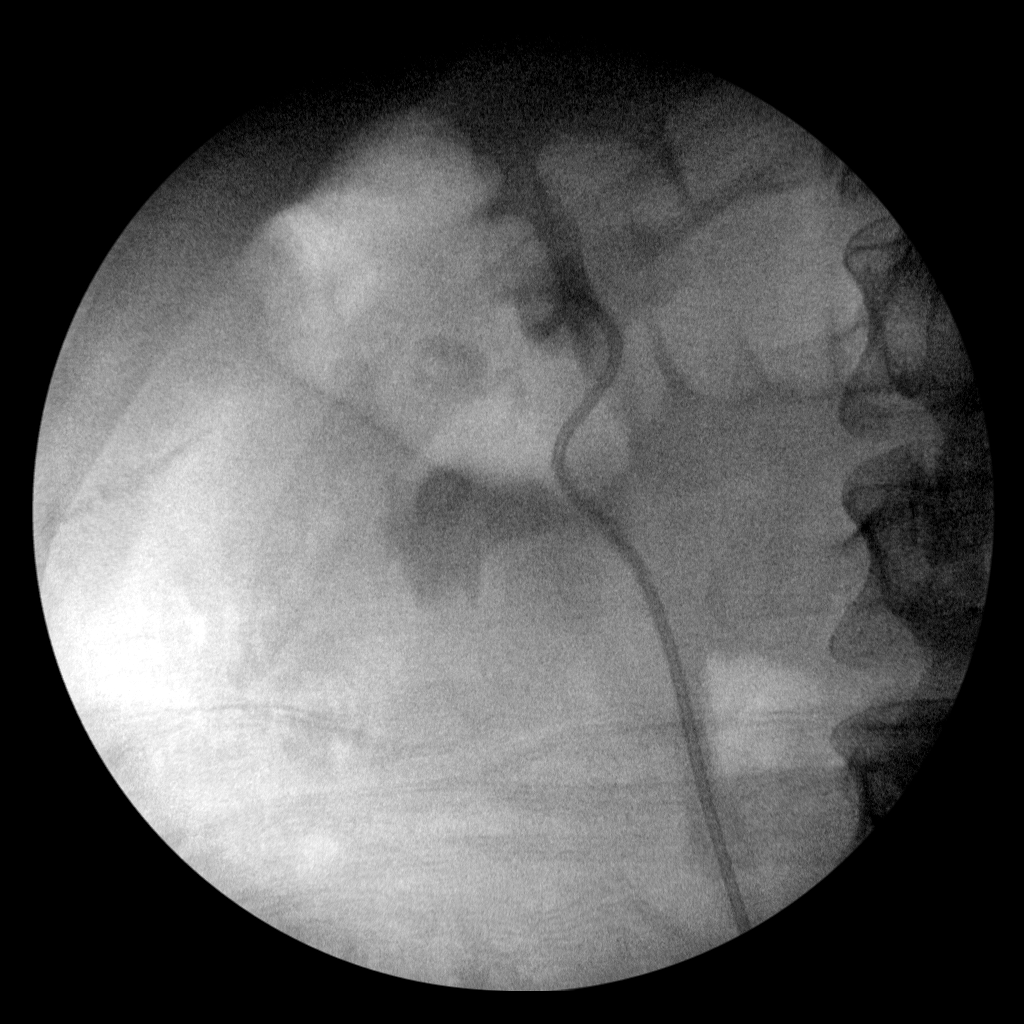
[im 4/4]
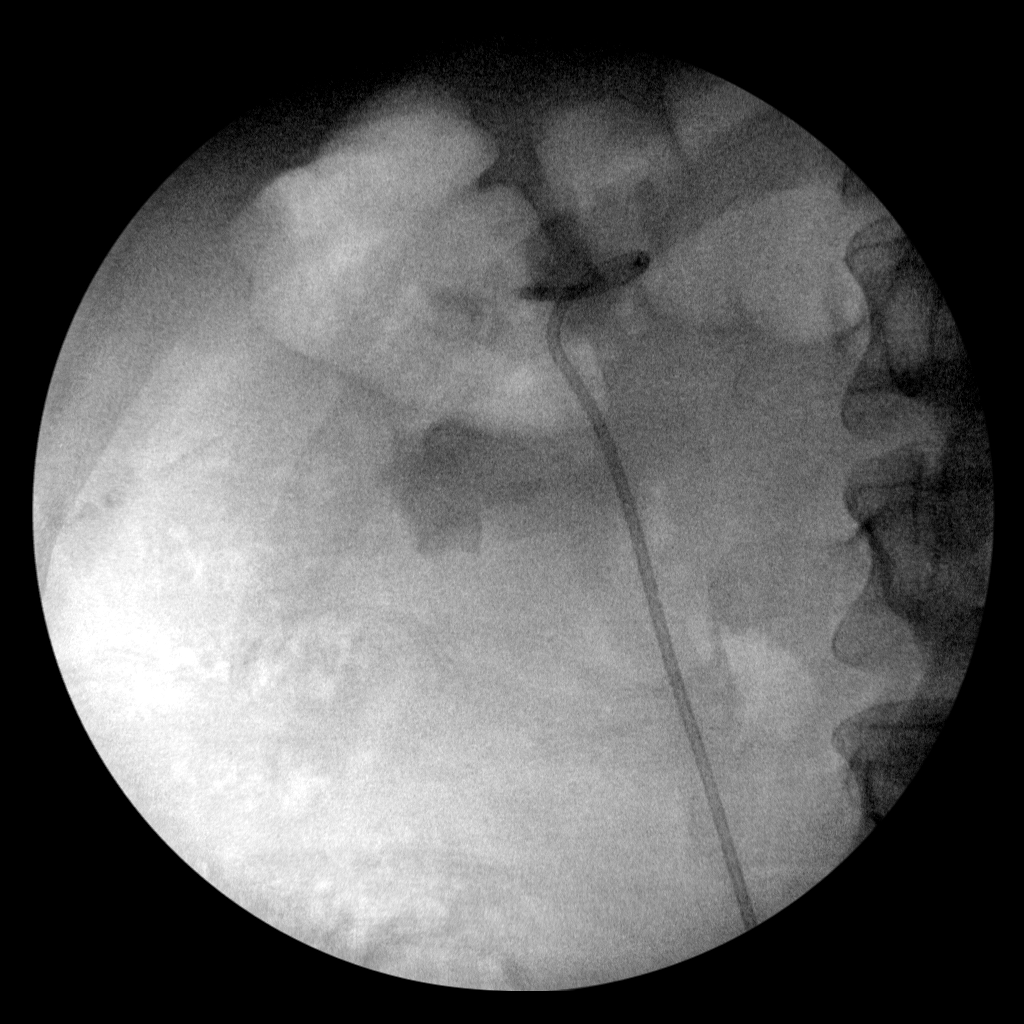

[4 of 4 positions shown; findings below may reference images not displayed]

FINDINGS: A series of 4 fluoroscopic spot images document retrograde
opacification of a dilated appearing proximal right ureter and
intrarenal collecting system. Subsequent images demonstrate
placement of pigtail catheter with the proximal coil projecting
within an upper pole renal calyx.
IMPRESSION: Right ureteral stent placement with proximal coil of the right
ureteral stent projecting within an upper pole renal calyx.

## 2018-09-20 ENCOUNTER — Emergency Department (HOSPITAL_COMMUNITY): Payer: Self-pay

## 2018-09-20 ENCOUNTER — Other Ambulatory Visit: Payer: Self-pay

## 2018-09-20 ENCOUNTER — Encounter (HOSPITAL_COMMUNITY): Payer: Self-pay | Admitting: Emergency Medicine

## 2018-09-20 ENCOUNTER — Emergency Department (HOSPITAL_COMMUNITY)
Admission: EM | Admit: 2018-09-20 | Discharge: 2018-09-20 | Disposition: A | Discharge status: Home / Self Care | Payer: Self-pay | Attending: Emergency Medicine | Admitting: Emergency Medicine

## 2018-09-20 DIAGNOSIS — I10 Essential (primary) hypertension: Secondary | ICD-10-CM | POA: Insufficient documentation

## 2018-09-20 DIAGNOSIS — R109 Unspecified abdominal pain: Secondary | ICD-10-CM | POA: Insufficient documentation

## 2018-09-20 DIAGNOSIS — Z87891 Personal history of nicotine dependence: Secondary | ICD-10-CM | POA: Insufficient documentation

## 2018-09-20 DIAGNOSIS — Z87442 Personal history of urinary calculi: Secondary | ICD-10-CM | POA: Insufficient documentation

## 2018-09-20 HISTORY — DX: Disorder of kidney and ureter, unspecified: N28.9

## 2018-09-20 LAB — URINALYSIS, ROUTINE W REFLEX MICROSCOPIC
Bilirubin Urine: NEGATIVE
Glucose, UA: NEGATIVE mg/dL
Hgb urine dipstick: NEGATIVE
Ketones, ur: NEGATIVE mg/dL
Leukocytes,Ua: NEGATIVE
Nitrite: NEGATIVE
Protein, ur: NEGATIVE mg/dL
Specific Gravity, Urine: 1.023 (ref 1.005–1.030)
pH: 6 (ref 5.0–8.0)

## 2018-09-20 MED ORDER — HYDROCODONE-ACETAMINOPHEN 5-325 MG PO TABS: ORAL_TABLET | ORAL | 0 refills | Status: AC

## 2018-09-20 MED ORDER — HYDROMORPHONE HCL 1 MG/ML IJ SOLN
1.0000 mg | Freq: Once | INTRAMUSCULAR | Status: AC
  Administered 2018-09-20: 11:00:00 1 mg via INTRAMUSCULAR
  Filled 2018-09-20: qty 1

## 2018-09-20 MED ORDER — METHOCARBAMOL 500 MG PO TABS: 1000.0000 mg | ORAL_TABLET | Freq: Four times a day (QID) | ORAL | 0 refills | Status: AC | PRN

## 2018-09-20 NOTE — ED Notes (Signed)
Family at bedside. 

## 2018-09-20 NOTE — Discharge Instructions (Signed)
Take the prescriptions as directed. Your CT scan shows an incidental finding(s):  "Distal right hydroureter, possibly the sequela of the prior UVJ obstruction in 2017."  Call the Urologist today to schedule a follow up appointment within the next week for this finding.  Apply moist heat or ice to the area(s) of discomfort, for 15 minutes at a time, several times per day for the next few days.  Do not fall asleep on a heating or ice pack.  Call your regular medical doctor today to schedule a follow up appointment this week.   Return to the Emergency Department immediately if worsening.

## 2018-09-20 NOTE — ED Provider Notes (Signed)
[facility] EMERGENCY DEPARTMENT Provider Note   CSN: [id number] Arrival date & time: 09/20/18  5916     History   Chief Complaint Chief Complaint  Patient presents with   Flank Pain    HPI Larry Hoover is a 48 y.o. male.     HPI  Pt was seen at 0930. Per pt, c/o sudden onset and persistence of constant left sided flank "pain" for the past 1 week. Pt describes the pain as "like my last kidney stone," and radiating into the left side of his abd.  Has been associated with nausea. Pt also states he has had "bruising" for the past 3 days. Pt states he has been "putting a heating pack on it" with improvement.   Denies testicular pain/swelling, no dysuria/hematuria, no abd pain, no vomiting/diarrhea, no black or blood in stools, no CP/SOB, no fevers, no rash.    Past Medical History:  Diagnosis Date   Cancer [facility])    testicular cancer in 2007   GERD (gastroesophageal reflux disease)    History of traumatic head injury    Hypertension    Renal disorder    kidney stones   Seizures ([place])    last seizure was 2 months ago; states are caused by head trauma in past; and no meds worked and so on no meds now.    Patient Active Problem List   Diagnosis Date Noted   Erosive esophagitis 01/10/2013   Nausea alone 12/06/2012   Abdominal pain, chronic, epigastric 12/06/2012   Opiate abuse, continuous ([place]) 05/11/2012    Class: Acute   Benzodiazepine abuse, continuous ([place]) 05/11/2012   Depressive disorder, not elsewhere classified 05/08/2012   Polysubstance (including opioids) dependence w/o physiol dependence ([place]) 05/08/2012   Mood disorder in conditions classified elsewhere 05/08/2012   Chronic pain syndrome 05/08/2012    Past Surgical History:  Procedure Laterality Date   APPENDECTOMY  2013 ruptured   CYSTOSCOPY W/ URETERAL STENT PLACEMENT Left 04/28/2014   Procedure: CYSTOSCOPY WITH LEFT  RETROGRADE PYELOGRAM LEFT URETERAL STENT PLACEMENT;  Surgeon: Irine Seal, MD;  Location: [facility] OR[facility];  Service: Urology;  Laterality: Left;   CYSTOSCOPY W/ URETERAL STENT REMOVAL Left 05/12/2014   Procedure: CYSTOSCOPY WITH LEFT URETERAL STENT REMOVAL;  Surgeon: Irine Seal, MD;  Location: [facility];  Service: Urology;  Laterality: Left;   CYSTOSCOPY WITH STENT PLACEMENT Right 11/09/2015   Procedure: CYSTOSCOPY WITH STENT PLACEMENT;  Surgeon: Cleon Gustin, MD;  Location: [facility];  Service: Urology;  Laterality: Right;   CYSTOSCOPY WITH URETEROSCOPY Left 05/12/2014   Procedure: LEFT URETEROSCOPY WITH STONE EXTRACTION;  Surgeon: Irine Seal, MD;  Location: [facility];  Service: Urology;  Laterality: Left;   CYSTOSCOPY/RETROGRADE/URETEROSCOPY/STONE EXTRACTION WITH BASKET Right 11/09/2015   Procedure: CYSTOSCOPY/RETROGRADE/STONE EXTRACTION WITH BASKET;  Surgeon: Cleon Gustin, MD;  Location: [facility];  Service: Urology;  Laterality: Right;   ESOPHAGOGASTRODUODENOSCOPY N/A 12/16/2012   Procedure: ESOPHAGOGASTRODUODENOSCOPY (EGD);  Surgeon: Rogene Houston, MD;  Location: [facility];  Service: Endoscopy;  Laterality: N/A;  255   HOLMIUM LASER APPLICATION Left 3/84/6659   Procedure: HOLMIUM LASER APPLICATION;  Surgeon: Irine Seal, MD;  Location: [facility];  Service: Urology;  Laterality: Left;   HOLMIUM LASER APPLICATION Right 9/35/7017   Procedure: HOLMIUM LASER APPLICATION;  Surgeon: Cleon Gustin, MD;  Location: [facility];  Service: Urology;  Laterality: Right;   Right testical removed  2007   URETEROSCOPY Left 04/28/2014   Procedure: URETEROSCOPY;  Surgeon: Irine Seal, MD;  Location:  [facility];  Service: Urology;  Laterality: Left;        Home Medications    Prior to Admission medications   Medication Sig Start Date End Date Taking? Authorizing Provider  HYDROmorphone (DILAUDID) 4 MG tablet Take 1 tablet (4 mg total) by mouth every 6 (six) hours as needed for severe pain. 11/17/15   Milton Ferguson, MD  oxyCODONE-acetaminophen (PERCOCET) 10-325 MG tablet Take 1  tablet by mouth every 6 (six) hours as needed for pain. 11/09/15   McKenzie, Candee Furbish, MD  oxyCODONE-acetaminophen (PERCOCET) 5-325 MG tablet Take 1-2 tablets by mouth every 6 (six) hours as needed. Patient not taking: Reported on 11/17/2015 11/06/15   Isla Pence, MD  promethazine (PHENERGAN) 25 MG tablet Take 1 tablet (25 mg total) by mouth every 6 (six) hours as needed for nausea or vomiting. 11/09/15   McKenzie, Candee Furbish, MD  tamsulosin (FLOMAX) 0.4 MG CAPS capsule Take 1 capsule (0.4 mg total) by mouth daily. 11/09/15   McKenzie, Candee Furbish, MD    Family History No family history on file.  Social History Social History   Tobacco Use   Smoking status: Former Smoker    Packs/day: 1.00    Years: 1.00    Pack years: 1.00    Types: Cigarettes    Quit date: 05/14/1996    Years since quitting: 22.3   Smokeless tobacco: Never Used  Substance Use Topics   Alcohol use: No   Drug use: Yes    Frequency: 1.0 times per week    Types: Marijuana    Comment: buys pain pills "off the street", last used [facility] 09/19/18     Allergies   Penicillins and Morphine and related   Review of Systems Review of Systems ROS: Statement: All systems negative except as marked or noted in the HPI; Constitutional: Negative for fever and chills. ; ; Eyes: Negative for eye pain, redness and discharge. ; ; ENMT: Negative for ear pain, hoarseness, nasal congestion, sinus pressure and sore throat. ; ; Cardiovascular: Negative for chest pain, palpitations, diaphoresis, dyspnea and peripheral edema. ; ; Respiratory: Negative for cough, wheezing and stridor. ; ; Gastrointestinal: +nausea. Negative for vomiting, diarrhea, abdominal pain, blood in stool, hematemesis, jaundice and rectal bleeding. . ; ; Genitourinary: +flank pain. Negative for dysuria, and hematuria. ; ; Genital:  No penile drainage or rash, no testicular pain or swelling, no scrotal rash or swelling. ;; Musculoskeletal: Negative for back pain and neck  pain. Negative for swelling and trauma.; ; Skin: Negative for pruritus, rash, abrasions, blisters, bruising and skin lesion.; ; Neuro: Negative for headache, lightheadedness and neck stiffness. Negative for weakness, altered level of consciousness, altered mental status, extremity weakness, paresthesias, involuntary movement, seizure and syncope.       Physical Exam Updated Vital Signs BP (!) 163/105 (BP Location: Right Arm)    Pulse 68    Temp 98 F (36.7 C) (Oral)    Resp 20    Ht 5\' 10"  (1.778 m)    Wt 77.1 kg    SpO2 100%    BMI 24.39 kg/m     Physical Exam 0935: Physical examination:  Nursing notes reviewed; Vital signs and O2 SAT reviewed;  Constitutional: Well developed, Well nourished, Well hydrated, Uncomfortable appearing; Head:  Normocephalic, atraumatic; Eyes: EOMI, PERRL, No scleral icterus; ENMT: Mouth and pharynx normal, Mucous membranes moist; Neck: Supple, Full range of motion, No lymphadenopathy; Cardiovascular: Regular rate and rhythm, No gallop; Respiratory: Breath sounds clear & equal bilaterally, No  wheezes.  Speaking full sentences with ease, Normal respiratory effort/excursion; Chest: Nontender, Movement normal; Abdomen: Soft, Nontender, Nondistended, Normal bowel sounds; Spine:  No midline CS, TS, LS tenderness. +TTP left lumbar paraspinal muscles. No rash, no ecchymosis.;; Genitourinary: No CVA tenderness; Extremities: Peripheral pulses normal, No tenderness, No edema, No calf edema or asymmetry.; Neuro: AA&Ox3, Major CN grossly intact.  Speech clear. No gross focal motor or sensory deficits in extremities.; Skin: Color normal, Warm, Dry.     ED Treatments / Results  Labs (all labs ordered are listed, but only abnormal results are displayed)   EKG None  Radiology   Procedures Procedures (including critical care time)  Medications Ordered in ED Medications  HYDROmorphone (DILAUDID) injection 1 mg (has no administration in time range)     Initial  Impression / Assessment and Plan / ED Course  I have reviewed the triage vital signs and the nursing notes.  Pertinent labs & imaging results that were available during my care of the patient were reviewed by me and considered in my medical decision making (see chart for details).    MDM Reviewed: previous chart, nursing note and vitals Reviewed previous: CT scan and ultrasound Interpretation: labs and CT scan    Results for orders placed or performed during the hospital encounter of 09/20/18  Urinalysis, Routine w reflex microscopic  Result Value Ref Range   Color, Urine YELLOW YELLOW   APPearance CLEAR CLEAR   Specific Gravity, Urine 1.023 1.005 - 1.030   pH 6.0 5.0 - 8.0   Glucose, UA NEGATIVE NEGATIVE mg/dL   Hgb urine dipstick NEGATIVE NEGATIVE   Bilirubin Urine NEGATIVE NEGATIVE   Ketones, ur NEGATIVE NEGATIVE mg/dL   Protein, ur NEGATIVE NEGATIVE mg/dL   Nitrite NEGATIVE NEGATIVE   Leukocytes,Ua NEGATIVE NEGATIVE   Ct Renal Stone Study Result Date: 09/20/2018 CLINICAL DATA:  Left flank pain and nausea for 7 days. History of renal calculi. EXAM: CT ABDOMEN AND PELVIS WITHOUT CONTRAST TECHNIQUE: Multidetector CT imaging of the abdomen and pelvis was performed following the standard protocol without IV contrast. COMPARISON:  CT scan 10/27/2015 FINDINGS: Lower chest: The lung bases are clear of acute process. No pleural effusion or pulmonary lesions. The heart is normal in size. No pericardial effusion. The distal esophagus and aorta are unremarkable. Hepatobiliary: No focal hepatic lesions or intrahepatic biliary dilatation. The gallbladder is normal. No common bile duct dilatation. Pancreas: No mass, inflammation or ductal dilatation. Spleen: Normal size.  No focal lesions. Adrenals/Urinary Tract: The adrenal glands are normal in stable. Small bilateral renal calculi are noted. No obstructing ureteral calculi or bladder calculi. There is distal right hydroureter. This could be the  sequela of prior right ureteral obstruction seen on the CT scan of No worrisome renal lesions or bladder lesions without contrast. Stomach/Bowel: The stomach, duodenum, small bowel and colon are grossly normal without oral contrast. No inflammatory changes, mass lesions or obstructive findings. The appendix is surgically absent. Vascular/Lymphatic: The aorta is normal in caliber. No atheroscerlotic calcifications. No mesenteric of retroperitoneal mass or adenopathy. Small scattered lymph nodes are noted. Reproductive: The prostate gland is mildly enlarged. The seminal vesicles appear norm Other: No pelvic mass or adenopathy. No free pelvic fluid collections. No inguinal mass or adenopathy. No abdominal wall hernia or subcutaneous lesions. Musculoskeletal: No significant bony findings. IMPRESSION: 1. Small bilateral renal calculi.  No obstructing ureteral calculi. 2. Distal right hydroureter, possibly the sequela of the prior UVJ obstruction in 2017. 3. No obvious renal or bladder mass  lesions. 4. No acute abdominal/pelvic findings, mass lesions or adenopathy. Electronically Signed   By: Marijo Sanes M.D.   On: 09/20/2018 13:12     Larry Hoover was evaluated in Emergency Department on 09/20/2018 for the symptoms described in the history of present illness. He was evaluated in the context of the global COVID-19 pandemic, which necessitated consideration that the patient might be at risk for infection with the SARS-CoV-2 virus that causes COVID-19. Institutional protocols and algorithms that pertain to the evaluation of patients at risk for COVID-19 are in a state of rapid change based on information released by regulatory bodies including the CDC and federal and state organizations. These policies and algorithms were followed during the patient's care in the ED.    1340: Tx symptomatically at this time.  Dx and testing, as well as incidental finding(s), d/w pt and family.  Questions answered.  Verb  understanding, agreeable to d/c home with outpt f/u.      Final Clinical Impressions(s) / ED Diagnoses   Final diagnoses:  None    ED Discharge Orders    None       Francine Graven, DO 09/23/18 1531

## 2018-09-20 NOTE — ED Triage Notes (Signed)
Pt with hx of kidney stones c/o LT sided flank pain with nausea x 7 days. Pt states that he will have intermittent bruising to flank. Denies GU symptoms.

## 2018-09-21 LAB — URINE CULTURE: Culture: NO GROWTH

## 2020-04-10 DEATH — deceased
